# Patient Record
Sex: Male | Born: 1980 | Race: White | Hispanic: No | Marital: Married | Smoking: Current some day smoker
Health system: Southern US, Community
[De-identification: ages and names within clinical notes are randomized; demographics above are authoritative.]

## PROBLEM LIST (undated history)

## (undated) DIAGNOSIS — N200 Calculus of kidney: Secondary | ICD-10-CM

## (undated) DIAGNOSIS — F419 Anxiety disorder, unspecified: Secondary | ICD-10-CM

## (undated) HISTORY — DX: Anxiety disorder, unspecified: F41.9

## (undated) HISTORY — PX: MOUTH SURGERY: SHX715

---

## 2011-08-04 ENCOUNTER — Ambulatory Visit: Payer: Self-pay | Admitting: Internal Medicine

## 2011-08-11 ENCOUNTER — Ambulatory Visit: Payer: Self-pay | Admitting: Urology

## 2011-08-12 ENCOUNTER — Emergency Department: Payer: Self-pay | Admitting: Internal Medicine

## 2011-08-18 ENCOUNTER — Ambulatory Visit: Payer: Self-pay | Admitting: Urology

## 2015-07-08 DIAGNOSIS — F410 Panic disorder [episodic paroxysmal anxiety] without agoraphobia: Secondary | ICD-10-CM | POA: Insufficient documentation

## 2015-07-08 DIAGNOSIS — Z7189 Other specified counseling: Secondary | ICD-10-CM | POA: Insufficient documentation

## 2015-07-15 ENCOUNTER — Encounter: Payer: Self-pay | Admitting: Cardiovascular Disease

## 2015-07-15 ENCOUNTER — Ambulatory Visit (INDEPENDENT_AMBULATORY_CARE_PROVIDER_SITE_OTHER): Payer: BLUE CROSS/BLUE SHIELD | Admitting: Cardiovascular Disease

## 2015-07-15 ENCOUNTER — Encounter (INDEPENDENT_AMBULATORY_CARE_PROVIDER_SITE_OTHER): Payer: Self-pay

## 2015-07-15 VITALS — BP 128/72 | HR 73 | Ht 66.0 in | Wt 136.2 lb

## 2015-07-15 DIAGNOSIS — R079 Chest pain, unspecified: Secondary | ICD-10-CM | POA: Diagnosis not present

## 2015-07-15 DIAGNOSIS — R002 Palpitations: Secondary | ICD-10-CM | POA: Diagnosis not present

## 2015-07-15 DIAGNOSIS — R55 Syncope and collapse: Secondary | ICD-10-CM | POA: Diagnosis not present

## 2015-07-15 NOTE — Progress Notes (Signed)
   HPI  This is a pleasant 34 year old male who was referred from [facility] urgent care for evaluation of chest pain and palpitations. The patient has no previous cardiac history. Other than chronic anxiety and tobacco use, he has no chronic medical conditions. He works in Consulting civil engineer at [employer]. Last month, he smoked marijuana and shortly after that he started having severe substernal chest pain. He reports prolonged history of fluttering sensation and skipping in his heart with no prolonged tachycardia. He also reports recurrent syncopal episodes in the setting of blood draws or seeing blood in general. All the symptoms do not seem to be exertional. He does have known history of anxiety and was started recently on Lexapro. He tries to stay active and exercises and reports no exertional symptoms. He has no family history of premature coronary artery disease, arrhythmia or sudden death.   No Known Allergies   No current outpatient prescriptions on file prior to visit.   No current facility-administered medications on file prior to visit.     History reviewed. No pertinent past medical history.   History reviewed. No pertinent past surgical history.   Family History  Problem Relation Age of Onset  . Family history unknown: Yes     Social History   Social History  . Marital Status: Married    Spouse Name: N/A  . Number of Children: N/A  . Years of Education: N/A   Occupational History  . Not on file.   Social History Main Topics  . Smoking status: Current Some Day Smoker  . Smokeless tobacco: Not on file  . Alcohol Use: Yes  . Drug Use: No  . Sexual Activity: Not on file   Other Topics Concern  . Not on file   Social History Narrative  . No narrative on file     ROS A 10 point review of system was performed. It is negative other than that mentioned in the history of present illness.   PHYSICAL EXAM   BP 128/72 mmHg  Pulse 73  Ht  (1.676 m)  Wt 136 lb 4 oz  (61.803 kg)  BMI 22.00 kg/m2 Constitutional: He is oriented to person, place, and time. He appears well-developed and well-nourished. No distress.  HENT: No nasal discharge.  Head: Normocephalic and atraumatic.  Eyes: Pupils are equal and round.  No discharge. Neck: Normal range of motion. Neck supple. No JVD present. No thyromegaly present.  Cardiovascular: Normal rate, regular rhythm, normal heart sounds. Exam reveals no gallop and no friction rub. No murmur heard.  Pulmonary/Chest: Effort normal and breath sounds normal. No stridor. No respiratory distress. He has no wheezes. He has no rales. He exhibits no tenderness.  Abdominal: Soft. Bowel sounds are normal. He exhibits no distension. There is no tenderness. There is no rebound and no guarding.  Musculoskeletal: Normal range of motion. He exhibits no edema and no tenderness.  Neurological: He is alert and oriented to person, place, and time. Coordination normal.  Skin: Skin is warm and dry. No rash noted. He is not diaphoretic. No erythema. No pallor.  Psychiatric: He has a normal mood and affect. His behavior is normal. Judgment and thought content normal.       ZOX:WRUEA  Rhythm  WITHIN NORMAL LIMITS    ASSESSMENT AND PLAN

## 2015-07-15 NOTE — Assessment & Plan Note (Signed)
Symptoms are overall suggestive of premature beats and no prolonged tachyarrhythmia. I explained to him the benign nature of these in the setting of no structural or ischemic heart disease.

## 2015-07-15 NOTE — Patient Instructions (Signed)
Medication Instructions:  Your physician recommends that you continue on your current medications as directed. Please refer to the Current Medication list given to you today.   Labwork: none  Testing/Procedures: Your physician has requested that you have an echocardiogram. Echocardiography is a painless test that uses sound waves to create images of your heart. It provides your doctor with information about the size and shape of your heart and how well your heart's chambers and valves are working. This procedure takes approximately one hour. There are no restrictions for this procedure.    Follow-Up: Your physician recommends that you schedule a follow-up appointment as needed with Dr. Arida   Any Other Special Instructions Will Be Listed Below (If Applicable).  Echocardiogram An echocardiogram, or echocardiography, uses sound waves (ultrasound) to produce an image of your heart. The echocardiogram is simple, painless, obtained within a short period of time, and offers valuable information to your health care provider. The images from an echocardiogram can provide information such as:  Evidence of coronary artery disease (CAD).  Heart size.  Heart muscle function.  Heart valve function.  Aneurysm detection.  Evidence of a past heart attack.  Fluid buildup around the heart.  Heart muscle thickening.  Assess heart valve function. LET YOUR HEALTH CARE PROVIDER KNOW ABOUT:  Any allergies you have.  All medicines you are taking, including vitamins, herbs, eye drops, creams, and over-the-counter medicines.  Previous problems you or members of your family have had with the use of anesthetics.  Any blood disorders you have.  Previous surgeries you have had.  Medical conditions you have.  Possibility of pregnancy, if this applies. BEFORE THE PROCEDURE  No special preparation is needed. Eat and drink normally.  PROCEDURE   In order to produce an image of your heart, gel  will be applied to your chest and a wand-like tool (transducer) will be moved over your chest. The gel will help transmit the sound waves from the transducer. The sound waves will harmlessly bounce off your heart to allow the heart images to be captured in real-time motion. These images will then be recorded.  You may need an IV to receive a medicine that improves the quality of the pictures. AFTER THE PROCEDURE You may return to your normal schedule including diet, activities, and medicines, unless your health care provider tells you otherwise. Document Released: 10/22/2000 Document Revised: 03/11/2014 Document Reviewed: 07/02/2013 ExitCare Patient Information 2015 [employer]. This information is not intended to replace advice given to you by your health care provider. Make sure you discuss any questions you have with your health care provider.  

## 2015-07-15 NOTE — Assessment & Plan Note (Signed)
The history is highly suggestive of vasovagal syncope. He is not orthostatic. This is a separate issue from his other complaints.

## 2015-07-15 NOTE — Assessment & Plan Note (Signed)
Chest pain is overall atypical and there is likely a component of anxiety. He does have other symptoms associated with this including palpitations which are suggestive of premature beats. I requested an echocardiogram to ensure no structural or congenital heart abnormalities.

## 2015-07-25 ENCOUNTER — Other Ambulatory Visit: Payer: BLUE CROSS/BLUE SHIELD

## 2015-08-21 ENCOUNTER — Encounter: Payer: Self-pay | Admitting: Psychiatry

## 2015-08-21 ENCOUNTER — Ambulatory Visit (INDEPENDENT_AMBULATORY_CARE_PROVIDER_SITE_OTHER): Payer: Self-pay | Admitting: Psychiatry

## 2015-08-21 VITALS — BP 122/68 | HR 74 | Temp 97.6°F | Ht 66.5 in | Wt 135.6 lb

## 2015-08-21 DIAGNOSIS — F4010 Social phobia, unspecified: Secondary | ICD-10-CM

## 2015-08-21 DIAGNOSIS — F410 Panic disorder [episodic paroxysmal anxiety] without agoraphobia: Secondary | ICD-10-CM

## 2015-08-21 MED ORDER — LORAZEPAM 0.5 MG PO TABS: 0.5000 mg | ORAL_TABLET | Freq: Two times a day (BID) | ORAL | Status: DC | PRN

## 2015-08-21 MED ORDER — ESCITALOPRAM OXALATE 10 MG PO TABS: 15.0000 mg | ORAL_TABLET | Freq: Every day | ORAL | Status: DC

## 2015-08-21 NOTE — Progress Notes (Signed)
Psychiatric Initial Adult Assessment   Patient Identification: Russell DonathMark J Duell Jr. MRN:  [id number] Date of Evaluation:  08/21/2015 Referral Source: [facility] Chief Complaint:  "I guess I did not realize I had an anxiety issue." Chief Complaint    Establish Care     Visit Diagnosis:    ICD-9-CM ICD-10-CM   1. Panic disorder 300.01 F41.0   2. Social anxiety disorder 300.23 F40.10    Diagnosis:   Patient Active Problem List   Diagnosis Date Noted  . Pain in the chest [R07.9] 07/15/2015  . Palpitations [R00.2] 07/15/2015  . Vasovagal syncope [R55] 07/15/2015  . Anxiety attack [F41.0] 07/08/2015  . Other specified counseling [Z71.89] 07/08/2015   History of Present Illness:  Patient indicates that one month ago he had a breakdown in his boss's office. He states that he began to cry uncontrollably for about 20-30 minutes. He states then that ended and then he found it very hard to calm down the rest of the day. He states he was very anxious. He states that either the day before or the day after that incident there was a group photo and he developed significant anxiety and if the photo had not been taken when it was he would've had to leave that situation. He indicated that time he developed shortness of breath, anxiety and feeling closed in. However he is able to state that one week ago he went to a concert and he did not have any problems there.   He states that he feels very uncomfortable in closed situations and in social situations. He states that he somehow fears the interaction. Cites for example if there is an event for one of his children he has anxiety about engaging in conversation with other parents.He states he believes that it was because there is no focus on observing him or any issues with him having to engage in conversation or be observed by others  He indicated that about a month ago he began seeing an EAP counselor at work. He states also about one month ago he was  placed on some Lexapro from his primary care physician. He states that initially when he took Lexapro he had some sedation and thus is been taking it at night. He states that he also when he took it initially felt somewhat jumpy and anxious. He states that has now settled down. He believes the Lexapro has helped him and is even out his mood.  Does discuss having some irritability and impatience with others. He states he doesn't feel irritable but it is something that others have noticed such as his wife. He states he has difficulty with attention. For example he states he can be in a meeting at work and that after 30 minutes he is unable to contribute because he has not been focusing. He denies any past diagnosis with ADHD and I did discuss with him I could refer him for assessment for that. However he indicated he would wait and see if it is more related to his anxiety or other issues at this time.  Elements:  Duration:  As above. Associated Signs/Symptoms: Depression Symptoms:  depressed mood, Low motivation and possibly decreased appetite. He states that any depressive episodes occur maybe once per month and last 2 days. (Hypo) Manic Symptoms:  None Anxiety Symptoms:  As discussed above Psychotic Symptoms:  None PTSD Symptoms: NA Patient denies any psychiatric hospitalizations. He denies any psychiatric treatment or counseling on the outpatient basis besides from his recent  contact with EAP. Past Medical History:  Past Medical History  Diagnosis Date  . Anxiety     Past Surgical History  Procedure Laterality Date  . Mouth surgery     Family History:  Family History  Problem Relation Age of Onset  . Alcohol abuse Mother   . Anxiety disorder Mother   . Diabetes Father   . Kidney disease Sister   . Anxiety disorder Sister    Social History:   Social History   Social History  . Marital Status: Married    Spouse Name: N/A  . Number of Children: N/A  . Years of Education: N/A    Social History Main Topics  . Smoking status: Former Smoker    Start date: 05/07/2000    Quit date: 05/07/2004  . Smokeless tobacco: Never Used  . Alcohol Use: 8.4 oz/week    0 Standard drinks or equivalent, 7 Shots of liquor, 7 Cans of beer per week  . Drug Use: Yes    Special: Marijuana     Comment: last used about 3 weeks ago  . Sexual Activity: Yes   Other Topics Concern  . None   Social History Narrative   Additional Social History: Patient's parents divorced. He does continue to see his mother but has not seen his father in several years. He describes a somewhat strained process of their divorce.  Patient has been married for 11 years. He has 2 sons ages 7 and 37. 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 He has been at this job for 9 years and states "overall" he enjoys it.  Family history psychiatric illness he relates that his mother is treated for anxiety and possibly depression. He recalls she's been on Wellbutrin. He states his sisters treated for anxiety is on Celexa.  Musculoskeletal: Strength & Muscle Tone: within normal limits Gait & Station: normal Patient leans: N/A  Psychiatric Specialty Exam: HPI  Review of Systems  Psychiatric/Behavioral: Negative for depression, suicidal ideas, hallucinations, memory loss and substance abuse. The patient is nervous/anxious. The patient does not have insomnia.        He reports some irritability and impaired concentration  All other systems reviewed and are negative.   Blood pressure 122/68, pulse 74, temperature 97.6 F (36.4 C), temperature source Tympanic, height 5' 6.5" (1.689 m), weight 135 lb 9.6 oz (61.508 kg), SpO2 99 %.Body mass index is 21.56 kg/(m^2).  General Appearance: Neat and Well Groomed  Eye Contact:  Good  Speech:  Normal Rate  Volume:  Normal  Mood:  Good  Affect:  Constricted  Thought Process:   Linear and Logical  Orientation:  Full (Time, Place, and Person)  Thought Content:  Negative  Suicidal Thoughts:  No  Homicidal Thoughts:  No  Memory:  Immediate;   Good Recent;   Good Remote;   Good  Judgement:  Good  Insight:  Good  Psychomotor Activity:  Negative  Concentration:  Good  Recall:  Good  Fund of Knowledge:Good  Language: Good  Akathisia:  Negative  Handed:    AIMS (if indicated):  N/A  Assets:  Communication Skills Desire for Improvement Social Support Vocational/Educational  ADL's:  Intact  Cognition: WNL  Sleep:  good   Is the patient at risk to self?  No. Has the patient been a risk to self in the past 6 months?  No. Has the patient been a risk to  self within the distant past?  No. Is the patient a risk to others?  No. Has the patient been a risk to others in the past 6 months?  No. Has the patient been a risk to others within the distant past?  No.  Allergies:  No Known Allergies Current Medications: Current Outpatient Prescriptions  Medication Sig Dispense Refill  . escitalopram (LEXAPRO) 10 MG tablet Take 1.5 tablets (15 mg total) by mouth daily. 30 tablet 1  . hydrOXYzine (ATARAX/VISTARIL) 25 MG tablet TAKE 1 TABLET BY MOUTH FOUR TIMES A DAY AS NEEDED AS NEEDED FOR ANXIETY  0  . LORazepam (ATIVAN) 0.5 MG tablet Take 1 tablet (0.5 mg total) by mouth 2 (two) times daily as needed for anxiety. 60 tablet 1   No current facility-administered medications for this visit.    Previous Psychotropic Medications: Yes  Patient has been on Lexapro as discussed above. He also states that he was prescribed some hydroxyzine 25 mg doses to take for his panic attacks. He relates that it caused sedation and did make him go to sleep. Substance Abuse History in the last 12 months:  No.  Consequences of Substance Abuse: Negative  Medical Decision Making:  New Problem, with no additional work-up planned (3), Review of Medication Regimen & Side Effects (2) and Review  of New Medication or Change in Dosage (2)  Treatment Plan Summary: Medication management and Plan   Panic disorder without agoraphobia-patient reports not having any panic attacks as he had about one month ago. Feels that the Lexapro somewhat helped him. Thus we'll continue on his Lexapro at 15 mg daily.   Social anxiety disorder-this continues to be an issue for the patient. He does desire perhaps some dedication management for these situations. Risk and benefits of lorazepam have been discussed. We'll start lorazepam 0.5 mg twice a day as needed.   In regards to risk assessment the patient has risk factors of gender and race. He has protective factors of good social supports, married, employment, minor children living in the home, no suicide attempts, no symptoms indicative of a major depressive disorder. At this time low risk of imminent harm to himself or others.    Wallace Going 10/13/20169:50 AM

## 2015-09-01 ENCOUNTER — Emergency Department
Admission: EM | Admit: 2015-09-01 | Discharge: 2015-09-01 | Disposition: A | Discharge status: Home / Self Care | Payer: BLUE CROSS/BLUE SHIELD | Attending: Student | Admitting: Student

## 2015-09-01 ENCOUNTER — Emergency Department: Payer: BLUE CROSS/BLUE SHIELD

## 2015-09-01 DIAGNOSIS — R112 Nausea with vomiting, unspecified: Secondary | ICD-10-CM | POA: Insufficient documentation

## 2015-09-01 DIAGNOSIS — Z79899 Other long term (current) drug therapy: Secondary | ICD-10-CM | POA: Insufficient documentation

## 2015-09-01 DIAGNOSIS — N200 Calculus of kidney: Secondary | ICD-10-CM | POA: Diagnosis not present

## 2015-09-01 DIAGNOSIS — N390 Urinary tract infection, site not specified: Secondary | ICD-10-CM | POA: Diagnosis not present

## 2015-09-01 DIAGNOSIS — Z87891 Personal history of nicotine dependence: Secondary | ICD-10-CM | POA: Insufficient documentation

## 2015-09-01 DIAGNOSIS — M549 Dorsalgia, unspecified: Secondary | ICD-10-CM | POA: Diagnosis present

## 2015-09-01 LAB — BASIC METABOLIC PANEL
Anion gap: 6 (ref 5–15)
BUN: 13 mg/dL (ref 6–20)
CALCIUM: 9.2 mg/dL (ref 8.9–10.3)
CO2: 30 mmol/L (ref 22–32)
CREATININE: 1.06 mg/dL (ref 0.61–1.24)
Chloride: 102 mmol/L (ref 101–111)
Glucose, Bld: 111 mg/dL — ABNORMAL HIGH (ref 65–99)
Potassium: 3.8 mmol/L (ref 3.5–5.1)
SODIUM: 138 mmol/L (ref 135–145)

## 2015-09-01 LAB — CBC
HEMATOCRIT: 43.4 % (ref 40.0–52.0)
HEMOGLOBIN: 14.9 g/dL (ref 13.0–18.0)
MCH: 31.3 pg (ref 26.0–34.0)
MCHC: 34.4 g/dL (ref 32.0–36.0)
MCV: 91.2 fL (ref 80.0–100.0)
Platelets: 258 10*3/uL (ref 150–440)
RBC: 4.76 MIL/uL (ref 4.40–5.90)
RDW: 12.5 % (ref 11.5–14.5)
WBC: 6.5 10*3/uL (ref 3.8–10.6)

## 2015-09-01 LAB — URINALYSIS COMPLETE WITH MICROSCOPIC (ARMC ONLY)
BACTERIA UA: NONE SEEN
SPECIFIC GRAVITY, URINE: 1.015 (ref 1.005–1.030)
SQUAMOUS EPITHELIAL / LPF: NONE SEEN

## 2015-09-01 MED ORDER — DEXTROSE 5 % IV SOLN
1.0000 g | Freq: Once | INTRAVENOUS | Status: AC
  Administered 2015-09-01: 1 g via INTRAVENOUS

## 2015-09-01 MED ORDER — DEXTROSE 5 % IV SOLN
INTRAVENOUS | Status: AC
  Administered 2015-09-01: 1 g via INTRAVENOUS
  Filled 2015-09-01: qty 10

## 2015-09-01 MED ORDER — OXYCODONE-ACETAMINOPHEN 5-325 MG PO TABS: 1.0000 | ORAL_TABLET | Freq: Four times a day (QID) | ORAL | Status: DC | PRN

## 2015-09-01 MED ORDER — CEPHALEXIN 500 MG PO CAPS: 500.0000 mg | ORAL_CAPSULE | Freq: Four times a day (QID) | ORAL | Status: DC

## 2015-09-01 NOTE — ED Notes (Signed)
Patient comes complaining of right lower abdominal pain and blood in urine. Symptoms started at 9am.

## 2015-09-01 NOTE — ED Provider Notes (Signed)
[facility] Emergency Department Provider Note  ____________________________________________  Time seen: 1615  I have reviewed the triage vital signs and the nursing notes.   HISTORY  Chief Complaint Flank Pain     HPI Russell Rodgers. is a 34 y.o. male with a history of kidney stones who presents emergency Department with pain in the left flank and hematuria. This began this morning. He denies any nausea vomiting or diarrhea.  He denies any fever. He reports his pain currently is under control and moderate.   Past Medical History  Diagnosis Date  . Anxiety     Patient Active Problem List   Diagnosis Date Noted  . Pain in the chest 07/15/2015  . Palpitations 07/15/2015  . Vasovagal syncope 07/15/2015  . Anxiety attack 07/08/2015  . Other specified counseling 07/08/2015    Past Surgical History  Procedure Laterality Date  . Mouth surgery      Current Outpatient Rx  Name  Route  Sig  Dispense  Refill  . cephALEXin (KEFLEX) 500 MG capsule   Oral   Take 1 capsule (500 mg total) by mouth 4 (four) times daily.   28 capsule   0   . escitalopram (LEXAPRO) 10 MG tablet   Oral   Take 1.5 tablets (15 mg total) by mouth daily.   30 tablet   1   . hydrOXYzine (ATARAX/VISTARIL) 25 MG tablet      TAKE 1 TABLET BY MOUTH FOUR TIMES A DAY AS NEEDED AS NEEDED FOR ANXIETY      0   . LORazepam (ATIVAN) 0.5 MG tablet   Oral   Take 1 tablet (0.5 mg total) by mouth 2 (two) times daily as needed for anxiety.   60 tablet   1   . oxyCODONE-acetaminophen (ROXICET) 5-325 MG tablet   Oral   Take 1 tablet by mouth every 6 (six) hours as needed.   10 tablet   0     Allergies Review of patient's allergies indicates no known allergies.  Family History  Problem Relation Age of Onset  . Alcohol abuse Mother   . Anxiety disorder Mother   . Diabetes Father   . Kidney disease Sister   . Anxiety disorder Sister     Social History Social History   Substance Use Topics  . Smoking status: Former Smoker    Start date: 05/07/2000    Quit date: 05/07/2004  . Smokeless tobacco: Never Used  . Alcohol Use: 8.4 oz/week    0 Standard drinks or equivalent, 7 Shots of liquor, 7 Cans of beer per week    Review of Systems  Constitutional: Negative for fever. ENT: Negative for sore throat. Cardiovascular: Negative for chest pain. Respiratory: Negative for cough. Gastrointestinal: Negative for abdominal pain, vomiting and diarrhea. Genitourinary: Left flank plain with hematuria. See history of present illness Musculoskeletal: No myalgias or injuries. Skin: Negative for rash. Neurological: Negative for paresthesia or weakness   10-point ROS otherwise negative.  ____________________________________________   PHYSICAL EXAM:  VITAL SIGNS: ED Triage Vitals  Enc Vitals Group     BP 09/01/15 1334 127/77 mmHg     Pulse Rate 09/01/15 1334 78     Resp 09/01/15 1334 16     Temp 09/01/15 1334 98.7 F (37.1 C)     Temp Source 09/01/15 1334 Oral     SpO2 09/01/15 1334 97 %     Weight 09/01/15 1334 132 lb (59.875 kg)     Height 09/01/15  1334  (1.651 m)     Head Cir --      Peak Flow --      Pain Score --      Pain Loc --      Pain Edu? --      Excl. in GC? --     Constitutional: Alert and oriented. Well appearing and in no distress. ENT   Head: Normocephalic and atraumatic.   Nose: No congestion/rhinnorhea.    Cardiovascular: Normal rate, regular rhythm, no murmur noted Respiratory:  Normal respiratory effort, no tachypnea.    Breath sounds are clear and equal bilaterally.  Gastrointestinal: No distention.  Mild tenderness in left abdomen. Back: No muscle spasm, no tenderness, no CVA tenderness. Musculoskeletal: No deformity noted. Nontender with normal range of motion in all extremities.  No noted edema. Neurologic:  Normal speech and language. No gross focal neurologic deficits are appreciated.  Skin:  Skin is warm,  dry. No rash noted. Psychiatric: Mood and affect are normal. Speech and behavior are normal.  ____________________________________________    LABS (pertinent positives/negatives)  Labs Reviewed  URINALYSIS COMPLETEWITH MICROSCOPIC ([facility] ONLY) - Abnormal; Notable for the following:    Color, Urine RED (*)    APPearance CLOUDY (*)    Glucose, UA   (*)    Value: TEST NOT REPORTED DUE TO COLOR INTERFERENCE OF URINE PIGMENT   Bilirubin Urine   (*)    Value: TEST NOT REPORTED DUE TO COLOR INTERFERENCE OF URINE PIGMENT   Ketones, ur   (*)    Value: TEST NOT REPORTED DUE TO COLOR INTERFERENCE OF URINE PIGMENT   Hgb urine dipstick   (*)    Value: TEST NOT REPORTED DUE TO COLOR INTERFERENCE OF URINE PIGMENT   Protein, ur   (*)    Value: TEST NOT REPORTED DUE TO COLOR INTERFERENCE OF URINE PIGMENT   Nitrite   (*)    Value: TEST NOT REPORTED DUE TO COLOR INTERFERENCE OF URINE PIGMENT   Leukocytes, UA   (*)    Value: TEST NOT REPORTED DUE TO COLOR INTERFERENCE OF URINE PIGMENT   All other components within normal limits  BASIC METABOLIC PANEL - Abnormal; Notable for the following:    Glucose, Bld 111 (*)    All other components within normal limits  CBC     ____________________________________________   RADIOLOGY  CT abdomen and pelvis, renal stone protocol.  IMPRESSION: 1. No CT findings to explain the patient's clinical symptoms of right flank and right lower quadrant pain. 2. However, however, there has been slight interval enlargement of a chronic nonobstructing left UPJ stone when compared to 08/04/2011. No additional nephrolithiasis identified. 3. The appendix is normal.  ____________________________________________   INITIAL IMPRESSION / ASSESSMENT AND PLAN / ED COURSE  Pertinent labs & imaging results that were available during my care of the patient were reviewed by me and considered in my medical decision making (see chart for details).  Overall well-appearing  34 year old male with history of renal stones with hematuria. Of concern is that he also has pyuria, with white blood cells too numerous to count and clumps of white blood cells. We will obtain a CT scan to help [place] determine whether this is stone with pyuria or pyelonephritis.  ----------------------------------------- 5:42 PM on 09/01/2015 -----------------------------------------  CT does not show any obstructing stones and does not show any stranding consistent with pyelonephritis. Still, the patient has a retained stone from 2012 which is nonobstructing on the left which is  where his discomfort is. I suspect there may be a connection.  The patient appears well currently. He is comfortable and does not need any pain medications. We will begin treatment for the urinary tract infection. Ligament dose of ceftriaxone IV and send him home on Keflex. We have discussed having him follow-up with urology for further evaluation of this urinary tract infection and left flank pain.   ____________________________________________   FINAL CLINICAL IMPRESSION(S) / ED DIAGNOSES  Final diagnoses:  UTI (lower urinary tract infection)  Renal stone      Darien Ramusavid W Quantavis Obryant, MD 09/01/15 1747

## 2015-09-01 NOTE — ED Notes (Signed)
Pt has left flank pain and hematuria.  Sx began this am.  No n/v/d.  Hx of kidney stones.  Pt reports intermittent pain.  Iv in place from triage.

## 2015-09-01 NOTE — Discharge Instructions (Signed)
You have a nonobstructing kidney stone in the left kidney. It appears a little bit bigger than it did in 2012. It is unlikely to be causing an acute problem, but we are concerned it could be related to the urinary tract infection we are identifying today. Your treated with a dose of ceftriaxone in the emergency department. Take Keflex for ongoing antibiotic coverage. Follow-up with urology. Return to the emergency department if you have worsening pain, fevers, or other urgent concerns.  Kidney Stones Kidney stones (urolithiasis) are deposits that form inside your kidneys. The intense pain is caused by the stone moving through the urinary tract. When the stone moves, the ureter goes into spasm around the stone. The stone is usually passed in the urine.  CAUSES   A disorder that makes certain neck glands produce too much parathyroid hormone (primary hyperparathyroidism).  A buildup of uric acid crystals, similar to gout in your joints.  Narrowing (stricture) of the ureter.  A kidney obstruction present at birth (congenital obstruction).  Previous surgery on the kidney or ureters.  Numerous kidney infections. SYMPTOMS   Feeling sick to your stomach (nauseous).  Throwing up (vomiting).  Blood in the urine (hematuria).  Pain that usually spreads (radiates) to the groin.  Frequency or urgency of urination. DIAGNOSIS   Taking a history and physical exam.  Blood or urine tests.  CT scan.  Occasionally, an examination of the inside of the urinary bladder (cystoscopy) is performed. TREATMENT   Observation.  Increasing your fluid intake.  Extracorporeal shock wave lithotripsy--This is a noninvasive procedure that uses shock waves to break up kidney stones.  Surgery may be needed if you have severe pain or persistent obstruction. There are various surgical procedures. Most of the procedures are performed with the use of small instruments. Only small incisions are needed to  accommodate these instruments, so recovery time is minimized. The size, location, and chemical composition are all important variables that will determine the proper choice of action for you. Talk to your health care provider to better understand your situation so that you will minimize the risk of injury to yourself and your kidney.  HOME CARE INSTRUCTIONS   Drink enough water and fluids to keep your urine clear or pale yellow. This will help you to pass the stone or stone fragments.  Strain all urine through the provided strainer. Keep all particulate matter and stones for your health care provider to see. The stone causing the pain may be as small as a grain of salt. It is very important to use the strainer each and every time you pass your urine. The collection of your stone will allow your health care provider to analyze it and verify that a stone has actually passed. The stone analysis will often identify what you can do to reduce the incidence of recurrences.  Only take over-the-counter or prescription medicines for pain, discomfort, or fever as directed by your health care provider.  Keep all follow-up visits as told by your health care provider. This is important.  Get follow-up X-rays if required. The absence of pain does not always mean that the stone has passed. It may have only stopped moving. If the urine remains completely obstructed, it can cause loss of kidney function or even complete destruction of the kidney. It is your responsibility to make sure X-rays and follow-ups are completed. Ultrasounds of the kidney can show blockages and the status of the kidney. Ultrasounds are not associated with any radiation and can  be performed easily in a matter of minutes.  Make changes to your daily diet as told by your health care provider. You may be told to:  Limit the amount of salt that you eat.  Eat 5 or more servings of fruits and vegetables each day.  Limit the amount of meat,  poultry, fish, and eggs that you eat.  Collect a 24-hour urine sample as told by your health care provider.You may need to collect another urine sample every 6-12 months. SEEK MEDICAL CARE IF:  You experience pain that is progressive and unresponsive to any pain medicine you have been prescribed. SEEK IMMEDIATE MEDICAL CARE IF:   Pain cannot be controlled with the prescribed medicine.  You have a fever or shaking chills.  The severity or intensity of pain increases over 18 hours and is not relieved by pain medicine.  You develop a new onset of abdominal pain.  You feel faint or pass out.  You are unable to urinate.   This information is not intended to replace advice given to you by your health care provider. Make sure you discuss any questions you have with your health care provider.   Document Released: 10/25/2005 Document Revised: 07/16/2015 Document Reviewed: 03/28/2013 Elsevier Interactive Patient Education [employer]2016 Elsevier Inc.

## 2015-09-02 ENCOUNTER — Emergency Department
Admission: EM | Admit: 2015-09-02 | Discharge: 2015-09-02 | Disposition: A | Discharge status: Home / Self Care | Payer: BLUE CROSS/BLUE SHIELD | Source: Home / Self Care | Attending: Student | Admitting: Student

## 2015-09-02 ENCOUNTER — Encounter: Payer: Self-pay | Admitting: Emergency Medicine

## 2015-09-02 ENCOUNTER — Emergency Department
Admission: EM | Admit: 2015-09-02 | Discharge: 2015-09-02 | Disposition: A | Discharge status: Home / Self Care | Payer: BLUE CROSS/BLUE SHIELD | Source: Home / Self Care | Attending: Emergency Medicine | Admitting: Emergency Medicine

## 2015-09-02 DIAGNOSIS — N390 Urinary tract infection, site not specified: Secondary | ICD-10-CM

## 2015-09-02 DIAGNOSIS — R112 Nausea with vomiting, unspecified: Secondary | ICD-10-CM

## 2015-09-02 DIAGNOSIS — N200 Calculus of kidney: Secondary | ICD-10-CM

## 2015-09-02 HISTORY — DX: Calculus of kidney: N20.0

## 2015-09-02 LAB — URINALYSIS COMPLETE WITH MICROSCOPIC (ARMC ONLY)
BILIRUBIN URINE: NEGATIVE
GLUCOSE, UA: NEGATIVE mg/dL
Leukocytes, UA: NEGATIVE
NITRITE: NEGATIVE
Protein, ur: NEGATIVE mg/dL
SPECIFIC GRAVITY, URINE: 1.016 (ref 1.005–1.030)
Squamous Epithelial / LPF: NONE SEEN
pH: 8 (ref 5.0–8.0)

## 2015-09-02 MED ORDER — CEPHALEXIN 500 MG PO CAPS: 500.0000 mg | ORAL_CAPSULE | Freq: Once | ORAL | Status: DC

## 2015-09-02 MED ORDER — MORPHINE SULFATE (PF) 4 MG/ML IV SOLN
4.0000 mg | Freq: Once | INTRAVENOUS | Status: AC
  Administered 2015-09-02: 4 mg via INTRAVENOUS

## 2015-09-02 MED ORDER — HYDROMORPHONE HCL 1 MG/ML IJ SOLN
1.0000 mg | Freq: Once | INTRAMUSCULAR | Status: AC
  Administered 2015-09-02: 1 mg via INTRAVENOUS
  Filled 2015-09-02: qty 1

## 2015-09-02 MED ORDER — ONDANSETRON HCL 4 MG/2ML IJ SOLN
4.0000 mg | Freq: Once | INTRAMUSCULAR | Status: DC
Start: 2015-09-02 — End: 2015-09-02

## 2015-09-02 MED ORDER — KETOROLAC TROMETHAMINE 30 MG/ML IJ SOLN
30.0000 mg | Freq: Once | INTRAMUSCULAR | Status: AC
  Administered 2015-09-02: 30 mg via INTRAVENOUS
  Filled 2015-09-02: qty 1

## 2015-09-02 MED ORDER — SODIUM CHLORIDE 0.9 % IV BOLUS (SEPSIS)
1000.0000 mL | Freq: Once | INTRAVENOUS | Status: AC
  Administered 2015-09-02: 1000 mL via INTRAVENOUS

## 2015-09-02 MED ORDER — MORPHINE SULFATE (PF) 4 MG/ML IV SOLN: 4.0000 mg | Freq: Once | INTRAVENOUS | Status: DC

## 2015-09-02 MED ORDER — ONDANSETRON HCL 4 MG/2ML IJ SOLN
4.0000 mg | Freq: Once | INTRAMUSCULAR | Status: AC
  Administered 2015-09-02: 4 mg via INTRAVENOUS
  Filled 2015-09-02: qty 2

## 2015-09-02 MED ORDER — MORPHINE SULFATE (PF) 4 MG/ML IV SOLN
INTRAVENOUS | Status: AC
  Filled 2015-09-02: qty 1

## 2015-09-02 MED ORDER — ONDANSETRON HCL 4 MG/2ML IJ SOLN
4.0000 mg | Freq: Once | INTRAMUSCULAR | Status: AC
  Administered 2015-09-02: 4 mg via INTRAVENOUS

## 2015-09-02 MED ORDER — ONDANSETRON HCL 4 MG/2ML IJ SOLN
INTRAMUSCULAR | Status: AC
  Filled 2015-09-02: qty 2

## 2015-09-02 MED ORDER — ONDANSETRON 4 MG PO TBDP: 4.0000 mg | ORAL_TABLET | Freq: Three times a day (TID) | ORAL | Status: DC | PRN

## 2015-09-02 MED ORDER — CEPHALEXIN 500 MG PO CAPS
500.0000 mg | ORAL_CAPSULE | Freq: Once | ORAL | Status: AC
  Administered 2015-09-02: 500 mg via ORAL
  Filled 2015-09-02: qty 1

## 2015-09-02 NOTE — ED Provider Notes (Signed)
[facility] Emergency Department Provider Note  ____________________________________________  Time seen: Approximately 10:32 AM  I have reviewed the triage vital signs and the nursing notes.   HISTORY  Chief Complaint Flank Pain   HPI Russell Rodgers. is a 34 y.o. male who presents with left-sided groin pain. He has been in the ED twice in the past 24 hours for flank pain and subsequent vomiting and was diagnosed with UTI and non-obstructing left kidney stone. He was discharged on Keflex, Zofran, and Oxycodone. He was last discharged this morning around 4:10 AM. Around 7 AM his pain started again. He took Zofran by mouth as soon as he got it from the pharmacy around 9 AM, then took Oxycodone by mouth but still vomited it up. He vomited several times last night and has had nothing to eat since last night.  He last had a kidney stone in 2012 that he passed.   Past Medical History  Diagnosis Date  . Anxiety   . Kidney stones     Patient Active Problem List   Diagnosis Date Noted  . Pain in the chest 07/15/2015  . Palpitations 07/15/2015  . Vasovagal syncope 07/15/2015  . Anxiety attack 07/08/2015  . Other specified counseling 07/08/2015    Past Surgical History  Procedure Laterality Date  . Mouth surgery      Current Outpatient Rx  Name  Route  Sig  Dispense  Refill  . cephALEXin (KEFLEX) 500 MG capsule   Oral   Take 1 capsule (500 mg total) by mouth 4 (four) times daily.   28 capsule   0   . escitalopram (LEXAPRO) 10 MG tablet   Oral   Take 1.5 tablets (15 mg total) by mouth daily.   30 tablet   1   . hydrOXYzine (ATARAX/VISTARIL) 25 MG tablet      TAKE 1 TABLET BY MOUTH FOUR TIMES A DAY AS NEEDED AS NEEDED FOR ANXIETY      0   . LORazepam (ATIVAN) 0.5 MG tablet   Oral   Take 1 tablet (0.5 mg total) by mouth 2 (two) times daily as needed for anxiety.   60 tablet   1   . ondansetron (ZOFRAN ODT) 4 MG disintegrating tablet    Oral   Take 1 tablet (4 mg total) by mouth every 8 (eight) hours as needed for nausea or vomiting.   20 tablet   0   . oxyCODONE-acetaminophen (ROXICET) 5-325 MG tablet   Oral   Take 1 tablet by mouth every 6 (six) hours as needed.   10 tablet   0     Allergies Review of patient's allergies indicates no known allergies.  Family History  Problem Relation Age of Onset  . Alcohol abuse Mother   . Anxiety disorder Mother   . Diabetes Father   . Kidney disease Sister   . Anxiety disorder Sister     Social History Social History  Substance Use Topics  . Smoking status: Former Smoker    Start date: 05/07/2000    Quit date: 05/07/2004  . Smokeless tobacco: Never Used  . Alcohol Use: 8.4 oz/week    7 Cans of beer, 7 Shots of liquor, 0 Standard drinks or equivalent per week    Review of Systems Constitutional: No fever/chills Cardiovascular: Denies chest pain. Respiratory: Denies shortness of breath. Gastrointestinal: Positive for LLQ pain, nausea, vomiting.  No diarrhea.  No constipation. Genitourinary: Positive for red urine; has not passed urine  yet this morning. Negative for dysuria. Musculoskeletal: Negative for back pain. Skin: Negative for rash.   10-point ROS otherwise negative.  ____________________________________________   PHYSICAL EXAM:  VITAL SIGNS: ED Triage Vitals  Enc Vitals Group     BP 09/02/15 0927 163/113 mmHg     Pulse Rate 09/02/15 0927 77     Resp 09/02/15 0927 20     Temp 09/02/15 0927 98.1 F (36.7 C)     Temp Source 09/02/15 0927 Oral     SpO2 09/02/15 0927 100 %     Weight 09/02/15 0927 132 lb (59.875 kg)     Height 09/02/15 0927 5\' 6"  (1.676 m)     Head Cir --      Peak Flow --      Pain Score 09/02/15 0927 9     Pain Loc --      Pain Edu? --      Excl. in GC? --     Constitutional: Alert and oriented. Curled up lying in bed, in moderate distress, reluctant to move due to pain.  Head: Atraumatic. Neck: No stridor.    Cardiovascular: Normal rate, regular rhythm. Grossly normal heart sounds.  Good peripheral circulation. Respiratory: Normal respiratory effort.  No retractions. Lungs CTAB. Gastrointestinal: Soft, no distention. Hypoactive bowel sounds. Tender to palpation LLQ.  MSK: No CVA tenderness. Neurologic:  Normal speech and language. No gross focal neurologic deficits are appreciated.  Skin:  Skin is warm, dry and intact. No rash noted. Psychiatric: Mood and affect are normal. Speech and behavior are normal.  ____________________________________________   LABS (all labs ordered are listed, but only abnormal results are displayed)  Labs Reviewed - No data to display ____________________________________________  EKG  None ____________________________________________  RADIOLOGY  None; reviewed CT from 09/01/2015. ____________________________________________   PROCEDURES  Procedure(s) performed: None  Critical Care performed: No  ____________________________________________   INITIAL IMPRESSION / ASSESSMENT AND PLAN / ED COURSE  Pertinent labs & imaging results that were available during my care of the patient were reviewed by me and considered in my medical decision making (see chart for details).  Kidney stone causing left lower quadrant pain and vomiting; will give IV Zofran, Dilaudid, and fluids. Status post 2 hours of taking Dilaudid patient still complaining of pain. Nausea has resolved. Patient will be discharged and advised to continue previous medications. Patient also was advised to follow-up with urologist as directed from his previous visit today. ____________________________________________   FINAL CLINICAL IMPRESSION(S) / ED DIAGNOSES  Final diagnoses:  Nausea and vomiting in adult patient  UTI (lower urinary tract infection)  Kidney stone     Joni Reiningonald K Makayla Lanter, PA-C 09/02/15 1225  Gayla DossEryka A Gayle, MD 09/02/15 1615

## 2015-09-02 NOTE — ED Notes (Signed)

## 2015-09-02 NOTE — ED Notes (Signed)
States he was seen times 2 since last pm  States he is unable to keep down any meds and pain is increased

## 2015-09-02 NOTE — ED Notes (Signed)
Pt to ed with c/o left flank pain, states was seen here yesterday for UTI and then again last night for kidney stones,  States went home this am and the pain became worse.

## 2015-09-02 NOTE — ED Notes (Signed)
Spoke with Dr Mayford Knifewilliams regarding pt,  He reviewed labs and radiology from yesterday, no orders received and states patient should go to [facility] for care.

## 2015-09-02 NOTE — Discharge Instructions (Signed)
Kidney Stones °Kidney stones (urolithiasis) are deposits that form inside your kidneys. The intense pain is caused by the stone moving through the urinary tract. When the stone moves, the ureter goes into spasm around the stone. The stone is usually passed in the urine.  °CAUSES  °· A disorder that makes certain neck glands produce too much parathyroid hormone (primary hyperparathyroidism). °· A buildup of uric acid crystals, similar to gout in your joints. °· Narrowing (stricture) of the ureter. °· A kidney obstruction present at birth (congenital obstruction). °· Previous surgery on the kidney or ureters. °· Numerous kidney infections. °SYMPTOMS  °· Feeling sick to your stomach (nauseous). °· Throwing up (vomiting). °· Blood in the urine (hematuria). °· Pain that usually spreads (radiates) to the groin. °· Frequency or urgency of urination. °DIAGNOSIS  °· Taking a history and physical exam. °· Blood or urine tests. °· CT scan. °· Occasionally, an examination of the inside of the urinary bladder (cystoscopy) is performed. °TREATMENT  °· Observation. °· Increasing your fluid intake. °· Extracorporeal shock wave lithotripsy--This is a noninvasive procedure that uses shock waves to break up kidney stones. °· Surgery may be needed if you have severe pain or persistent obstruction. There are various surgical procedures. Most of the procedures are performed with the use of small instruments. Only small incisions are needed to accommodate these instruments, so recovery time is minimized. °The size, location, and chemical composition are all important variables that will determine the proper choice of action for you. Talk to your health care provider to better understand your situation so that you will minimize the risk of injury to yourself and your kidney.  °HOME CARE INSTRUCTIONS  °· Drink enough water and fluids to keep your urine clear or pale yellow. This will help you to pass the stone or stone fragments. °· Strain  all urine through the provided strainer. Keep all particulate matter and stones for your health care provider to see. The stone causing the pain may be as small as a grain of salt. It is very important to use the strainer each and every time you pass your urine. The collection of your stone will allow your health care provider to analyze it and verify that a stone has actually passed. The stone analysis will often identify what you can do to reduce the incidence of recurrences. °· Only take over-the-counter or prescription medicines for pain, discomfort, or fever as directed by your health care provider. °· Keep all follow-up visits as told by your health care provider. This is important. °· Get follow-up X-rays if required. The absence of pain does not always mean that the stone has passed. It may have only stopped moving. If the urine remains completely obstructed, it can cause loss of kidney function or even complete destruction of the kidney. It is your responsibility to make sure X-rays and follow-ups are completed. Ultrasounds of the kidney can show blockages and the status of the kidney. Ultrasounds are not associated with any radiation and can be performed easily in a matter of minutes. °· Make changes to your daily diet as told by your health care provider. You may be told to: °¨ Limit the amount of salt that you eat. °¨ Eat 5 or more servings of fruits and vegetables each day. °¨ Limit the amount of meat, poultry, fish, and eggs that you eat. °· Collect a 24-hour urine sample as told by your health care provider. You may need to collect another urine sample every 6-12   months. °SEEK MEDICAL CARE IF: °· You experience pain that is progressive and unresponsive to any pain medicine you have been prescribed. °SEEK IMMEDIATE MEDICAL CARE IF:  °· Pain cannot be controlled with the prescribed medicine. °· You have a fever or shaking chills. °· The severity or intensity of pain increases over 18 hours and is not  relieved by pain medicine. °· You develop a new onset of abdominal pain. °· You feel faint or pass out. °· You are unable to urinate. °  °This information is not intended to replace advice given to you by your health care provider. Make sure you discuss any questions you have with your health care provider. °  °Document Released: 10/25/2005 Document Revised: 07/16/2015 Document Reviewed: 03/28/2013 °Elsevier Interactive Patient Education ©2016 Elsevier Inc. ° °

## 2015-09-02 NOTE — Discharge Instructions (Signed)
Continue previous medications and contact urology for follow-up evaluation and treatment.

## 2015-09-02 NOTE — ED Provider Notes (Signed)
[facility] Emergency Department Provider Note  ____________________________________________  Time seen: 1:30 AM  I have reviewed the triage vital signs and the nursing notes.   HISTORY  Chief Complaint Back Pain     HPI Russell Rodgers. is a 34 y.o. male presents to the emergency department with worsening left flank pain. Patient was seen yesterday in ER and diagnosed with left ureterolithiasis. Patient states that he was unable to tolerate taking hydrocodone at home secondary to vomiting. Patient states current pain score is 10 out of 10     Past Medical History  Diagnosis Date  . Anxiety   . Kidney stones     Patient Active Problem List   Diagnosis Date Noted  . Pain in the chest 07/15/2015  . Palpitations 07/15/2015  . Vasovagal syncope 07/15/2015  . Anxiety attack 07/08/2015  . Other specified counseling 07/08/2015    Past Surgical History  Procedure Laterality Date  . Mouth surgery      Current Outpatient Rx  Name  Route  Sig  Dispense  Refill  . cephALEXin (KEFLEX) 500 MG capsule   Oral   Take 1 capsule (500 mg total) by mouth 4 (four) times daily.   28 capsule   0   . escitalopram (LEXAPRO) 10 MG tablet   Oral   Take 1.5 tablets (15 mg total) by mouth daily.   30 tablet   1   . hydrOXYzine (ATARAX/VISTARIL) 25 MG tablet      TAKE 1 TABLET BY MOUTH FOUR TIMES A DAY AS NEEDED AS NEEDED FOR ANXIETY      0   . LORazepam (ATIVAN) 0.5 MG tablet   Oral   Take 1 tablet (0.5 mg total) by mouth 2 (two) times daily as needed for anxiety.   60 tablet   1   . oxyCODONE-acetaminophen (ROXICET) 5-325 MG tablet   Oral   Take 1 tablet by mouth every 6 (six) hours as needed.   10 tablet   0     Allergies No known drug allergies  Family History  Problem Relation Age of Onset  . Alcohol abuse Mother   . Anxiety disorder Mother   . Diabetes Father   . Kidney disease Sister   . Anxiety disorder Sister     Social  History Social History  Substance Use Topics  . Smoking status: Former Smoker    Start date: 05/07/2000    Quit date: 05/07/2004  . Smokeless tobacco: Never Used  . Alcohol Use: 8.4 oz/week    7 Cans of beer, 7 Shots of liquor, 0 Standard drinks or equivalent per week    Review of Systems  Constitutional: Negative for fever. Eyes: Negative for visual changes. ENT: Negative for sore throat. Cardiovascular: Negative for chest pain. Respiratory: Negative for shortness of breath. Gastrointestinal: Positive for abdominal pain, positive vomiting  Genitourinary: Negative for dysuria. Musculoskeletal: Positive for back pain. Skin: Negative for rash. Neurological: Negative for headaches, focal weakness or numbness.  10-point ROS otherwise negative.  ____________________________________________   PHYSICAL EXAM:  VITAL SIGNS: ED Triage Vitals  Enc Vitals Group     BP 09/02/15 0012 132/82 mmHg     Pulse Rate 09/02/15 0012 82     Resp 09/02/15 0012 19     Temp 09/02/15 0012 98.3 F (36.8 C)     Temp Source 09/02/15 0012 Oral     SpO2 09/02/15 0012 100 %     Weight 09/02/15 0012 132 lb (59.875  kg)     Height 09/02/15 0012 5\' 6"  (1.676 m)     Head Cir --      Peak Flow --      Pain Score 09/02/15 0014 10     Pain Loc --      Pain Edu? --      Excl. in GC? --     Constitutional: Alert and oriented. Apparent discomfort Eyes: Conjunctivae are normal. PERRL. Normal extraocular movements. ENT   Head: Normocephalic and atraumatic.   Nose: No congestion/rhinnorhea.   Mouth/Throat: Mucous membranes are moist.   Neck: No stridor. Hematological/Lymphatic/Immunilogical: No cervical lymphadenopathy. Cardiovascular: Normal rate, regular rhythm. Normal and symmetric distal pulses are present in all extremities. No murmurs, rubs, or gallops. Respiratory: Normal respiratory effort without tachypnea nor retractions. Breath sounds are clear and equal bilaterally. No  wheezes/rales/rhonchi. Gastrointestinal: Soft and nontender. No distention. There is no CVA tenderness. Genitourinary: deferred Musculoskeletal: Nontender with normal range of motion in all extremities. No joint effusions.  No lower extremity tenderness nor edema. Neurologic:  Normal speech and language. No gross focal neurologic deficits are appreciated. Speech is normal.  Skin:  Skin is warm, dry and intact. No rash noted. Psychiatric: Mood and affect are normal. Speech and behavior are normal. Patient exhibits appropriate insight and judgment.  ____________________________________________    LABS (pertinent positives/negatives)  Labs Reviewed  URINALYSIS COMPLETEWITH MICROSCOPIC ([facility] ONLY) - Abnormal; Notable for the following:    Color, Urine YELLOW (*)    APPearance CLOUDY (*)    Ketones, ur 1+ (*)    Hgb urine dipstick 3+ (*)    Bacteria, UA RARE (*)    All other components within normal limits        INITIAL IMPRESSION / ASSESSMENT AND PLAN / ED COURSE  Pertinent labs & imaging results that were available during my care of the patient were reviewed by me and considered in my medical decision making (see chart for details).  Patient received IV morphine and Zofran for analgesia and antiemetic with improvement of pain. We'll prescribe the patient Zofran for home  ____________________________________________   FINAL CLINICAL IMPRESSION(S) / ED DIAGNOSES  Final diagnoses:  Kidney stone      Darci Currentandolph N Brown, MD 09/02/15 [phone]

## 2015-09-02 NOTE — ED Notes (Signed)
Patient seen here 10/24 and dx with renal colic. D/C to home. Tried pain medication at home but threw it up.

## 2015-09-03 ENCOUNTER — Encounter: Payer: Self-pay | Admitting: Obstetrics and Gynecology

## 2015-09-03 ENCOUNTER — Ambulatory Visit (INDEPENDENT_AMBULATORY_CARE_PROVIDER_SITE_OTHER): Payer: BLUE CROSS/BLUE SHIELD | Admitting: Obstetrics and Gynecology

## 2015-09-03 VITALS — BP 124/78 | HR 73 | Ht 66.0 in | Wt 140.2 lb

## 2015-09-03 DIAGNOSIS — R310 Gross hematuria: Secondary | ICD-10-CM | POA: Diagnosis not present

## 2015-09-03 DIAGNOSIS — N200 Calculus of kidney: Secondary | ICD-10-CM | POA: Diagnosis not present

## 2015-09-03 LAB — MICROSCOPIC EXAMINATION
BACTERIA UA: NONE SEEN
EPITHELIAL CELLS (NON RENAL): NONE SEEN /HPF (ref 0–10)
RBC, UA: >30 /hpf — AB (ref 0–?)
RENAL EPITHEL UA: NONE SEEN /HPF

## 2015-09-03 LAB — URINALYSIS, COMPLETE
Bilirubin, UA: NEGATIVE
Glucose, UA: NEGATIVE
Ketones, UA: NEGATIVE
LEUKOCYTES UA: NEGATIVE
Nitrite, UA: NEGATIVE
PH UA: 6 (ref 5.0–7.5)
PROTEIN UA: NEGATIVE
Specific Gravity, UA: 1.015 (ref 1.005–1.030)
Urobilinogen, Ur: 0.2 mg/dL (ref 0.2–1.0)

## 2015-09-03 MED ORDER — TAMSULOSIN HCL 0.4 MG PO CAPS: 0.4000 mg | ORAL_CAPSULE | Freq: Every day | ORAL | Status: DC

## 2015-09-03 NOTE — Progress Notes (Signed)
09/03/2015 8:30 AM   Russell DonathMark J Files Jr. 06/20/1981 [id number]  Referring provider: Maple Hudsonichard L Gilbert Jr., MD [address] [place], [place] [address]  Chief Complaint  Patient presents with  . Nephrolithiasis    New Patient    HPI: Patient is a 34yo male with a history of renal stones presenting today for follow up after being seen in the ED gross hematuria and acute onset of left upper quadrant pain.  Pain has progressed to his left lower quadrant and resolved today.  No further gross hematuria.  He has been using a urine strainer.  Four previous stone episodes.  Able to pass all with medical management.  H2O intake 1 bottle daily.  09/01/15 Cr 1.06  Former smoker 5 years in college.   PMH: Past Medical History  Diagnosis Date  . Anxiety   . Kidney stones     Surgical History: Past Surgical History  Procedure Laterality Date  . Mouth surgery      Home Medications:    Medication List       This list is accurate as of: 09/03/15 11:59 PM.  Always use your most recent med list.               cephALEXin 500 MG capsule  Commonly known as:  KEFLEX  Take 1 capsule (500 mg total) by mouth 4 (four) times daily.     escitalopram 10 MG tablet  Commonly known as:  LEXAPRO  Take 1.5 tablets (15 mg total) by mouth daily.     hydrOXYzine 25 MG tablet  Commonly known as:  ATARAX/VISTARIL  TAKE 1 TABLET BY MOUTH FOUR TIMES A DAY AS NEEDED AS NEEDED FOR ANXIETY     LORazepam 0.5 MG tablet  Commonly known as:  ATIVAN  Take 1 tablet (0.5 mg total) by mouth 2 (two) times daily as needed for anxiety.     ondansetron 4 MG disintegrating tablet  Commonly known as:  ZOFRAN ODT  Take 1 tablet (4 mg total) by mouth every 8 (eight) hours as needed for nausea or vomiting.     oxyCODONE-acetaminophen 5-325 MG tablet  Commonly known as:  ROXICET  Take 1 tablet by mouth every 6 (six) hours as needed.     tamsulosin 0.4 MG Caps capsule  Commonly known as:  FLOMAX   Take 1 capsule (0.4 mg total) by mouth daily.        Allergies: No Known Allergies  Family History: Family History  Problem Relation Age of Onset  . Alcohol abuse Mother   . Anxiety disorder Mother   . Diabetes Father   . Kidney disease Sister   . Anxiety disorder Sister   . Prostate cancer Maternal Grandfather   . Bladder Cancer Neg Hx   . Nephrolithiasis Father     Social History:  reports that he quit smoking about 11 years ago. He started smoking about 15 years ago. He has never used smokeless tobacco. He reports that he drinks about 8.4 oz of alcohol per week. He reports that he uses illicit drugs (Marijuana).  ROS: UROLOGY Frequent Urination?: Yes Hard to postpone urination?: No Burning/pain with urination?: No Get up at night to urinate?: Yes Leakage of urine?: Yes Urine stream starts and stops?: No Trouble starting stream?: No Do you have to strain to urinate?: No Blood in urine?: Yes Urinary tract infection?: Yes Sexually transmitted disease?: No Injury to kidneys or bladder?: No Painful intercourse?: No Weak stream?: Yes Erection problems?: No  Penile pain?: No  Gastrointestinal Nausea?: Yes Vomiting?: Yes Indigestion/heartburn?: No Diarrhea?: No Constipation?: Yes  Constitutional Fever: No Night sweats?: No Weight loss?: No Fatigue?: No  Skin Skin rash/lesions?: No Itching?: No  Eyes Blurred vision?: No Double vision?: No  Ears/Nose/Throat Sore throat?: No Sinus problems?: No  Hematologic/Lymphatic Swollen glands?: No Easy bruising?: No  Cardiovascular Leg swelling?: No Chest pain?: No  Respiratory Cough?: No Shortness of breath?: No  Endocrine Excessive thirst?: No  Musculoskeletal Back pain?: No Joint pain?: No  Neurological Headaches?: No Dizziness?: No  Psychologic Depression?: No Anxiety?: No  Physical Exam: BP 124/78 mmHg  Pulse 73  Ht  (1.676 m)  Wt 140 lb 3.2 oz (63.594 kg)  BMI 22.64 kg/m2   Constitutional:  Alert and oriented, No acute distress. HEENT: [place] AT, moist mucus membranes.  Trachea midline, no masses. Cardiovascular: No clubbing, cyanosis, or edema. Respiratory: Normal respiratory effort, no increased work of breathing. GI: Abdomen is soft, nontender, nondistended, no abdominal masses GU: No CVA tenderness.  Skin: No rashes, bruises or suspicious lesions. Lymph: No cervical or inguinal adenopathy. Neurologic: Grossly intact, no focal deficits, moving all 4 extremities. Psychiatric: Normal mood and affect.  Laboratory Data:   Urinalysis    Component Value Date/Time   COLORURINE YELLOW* 09/02/2015 0055   APPEARANCEUR CLOUDY* 09/02/2015 0055   LABSPEC 1.016 09/02/2015 0055   PHURINE 8.0 09/02/2015 0055   GLUCOSEU Negative 09/03/2015 1529   HGBUR 3+* 09/02/2015 0055   BILIRUBINUR Negative 09/03/2015 1529   BILIRUBINUR NEGATIVE 09/02/2015 0055   KETONESUR 1+* 09/02/2015 0055   PROTEINUR NEGATIVE 09/02/2015 0055   NITRITE Negative 09/03/2015 1529   NITRITE NEGATIVE 09/02/2015 0055   LEUKOCYTESUR Negative 09/03/2015 1529   LEUKOCYTESUR NEGATIVE 09/02/2015 0055    Pertinent Imaging: CLINICAL DATA: 34 year old male with right flank and right lower quadrant pain for the past 2 days accompanied by hematuria.  EXAM: CT ABDOMEN AND PELVIS WITHOUT CONTRAST  TECHNIQUE: Multidetector CT imaging of the abdomen and pelvis was performed following the standard protocol without IV contrast.  COMPARISON: Prior CT abdomen/pelvis 08/04/2011  FINDINGS: Lower Chest: The lung bases are clear. Visualized cardiac structures are within normal limits for size. No pericardial effusion. Unremarkable visualized distal thoracic esophagus.  Abdomen: Unenhanced CT was performed per clinician order. Lack of IV contrast limits sensitivity and specificity, especially for evaluation of abdominal/pelvic solid viscera. Within these limitations, unremarkable CT appearance of  the stomach, duodenum, spleen, adrenal glands and pancreas.  Normal hepatic contour morphology. No discrete hepatic lesion. Gallbladder is unremarkable. No intra or extrahepatic biliary ductal dilatation.  No evidence of hydronephrosis. 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 No additional nephrolithiasis identified.  No evidence of obstruction or focal bowel wall thickening. Normal appendix in the right lower quadrant. The terminal ileum is unremarkable. Moderate volume of stool throughout the colon. No free fluid or suspicious adenopathy.  Pelvis: Unremarkable bladder, prostate gland and seminal vesicles. No free fluid or suspicious adenopathy.  Bones/Soft Tissues: No acute fracture or aggressive appearing lytic or blastic osseous lesion.  Vascular: Limited evaluation in the absence of IV contrast. No aneurysm or significant atherosclerotic vascular calcification.  IMPRESSION: 1. No CT findings to explain the patient's clinical symptoms of right flank and right lower quadrant pain. 2. However, however, there has been slight interval enlargement of a chronic nonobstructing left UPJ stone when compared to 08/04/2011. No  additional nephrolithiasis identified. 3. The appendix is normal.  Assessment & Plan:    1. Nephrolithiasis- CT Renal Stone Protocol showing no evidence of hydronephrosis. 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 No additional nephrolithiasis identified. Pain resolved today.  Patient instructed to continue Flomax, Oxycodone prn and to push fluids. H ewill continue to strain his urine.  F/u KUB/RUS in 2 weeks. - Urinalysis, Complete  2. Gross hematuria- >30 RBCs on UA today, otherwise unremarkable. We discussed the differential diagnosis for gross  hematuria including nephrolithiasis, renal or upper tract tumors, bladder stones, UTIs, or bladder tumors as well as undetermined etiologies. Per AUA guidelines, I did recommend complete hematuria evaluation including CTU, possible urine cytology, and office cystoscopy.  Patient declined complete work up at this time.   Given his young age and very minimal smoking history I feel that this is reasonable at this time. He will follow up in in 2 weeks for a recheck and KUB/RUS.  Should gross hematuria return we will reconsider performing gross hematuria work up.   Return for KUB and RUS in 2 weeks f/u for results after.  These notes generated with voice recognition software. I apologize for typographical errors.  Earlie Lou, FNP  [facility] [address], [address] [place], [place] [address] [phone]

## 2015-09-17 ENCOUNTER — Ambulatory Visit
Admission: RE | Admit: 2015-09-17 | Discharge: 2015-09-17 | Disposition: A | Discharge status: Home / Self Care | Payer: BLUE CROSS/BLUE SHIELD | Source: Ambulatory Visit | Attending: Obstetrics and Gynecology | Admitting: Obstetrics and Gynecology

## 2015-09-17 DIAGNOSIS — R310 Gross hematuria: Secondary | ICD-10-CM | POA: Diagnosis not present

## 2015-09-17 DIAGNOSIS — N200 Calculus of kidney: Secondary | ICD-10-CM | POA: Insufficient documentation

## 2015-09-18 ENCOUNTER — Ambulatory Visit (INDEPENDENT_AMBULATORY_CARE_PROVIDER_SITE_OTHER): Payer: BLUE CROSS/BLUE SHIELD | Admitting: Psychiatry

## 2015-09-18 ENCOUNTER — Encounter: Payer: Self-pay | Admitting: Psychiatry

## 2015-09-18 VITALS — BP 132/78 | HR 62 | Temp 97.4°F | Ht 66.0 in | Wt 137.6 lb

## 2015-09-18 DIAGNOSIS — F4010 Social phobia, unspecified: Secondary | ICD-10-CM

## 2015-09-18 DIAGNOSIS — F410 Panic disorder [episodic paroxysmal anxiety] without agoraphobia: Secondary | ICD-10-CM | POA: Diagnosis not present

## 2015-09-18 MED ORDER — ESCITALOPRAM OXALATE 10 MG PO TABS: 15.0000 mg | ORAL_TABLET | Freq: Every day | ORAL | Status: DC

## 2015-09-18 NOTE — Progress Notes (Signed)
BH MD/PA/NP OP Progress Note  09/18/2015 10:44 AM Russell Rodgers.  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 He relates that things overall are well. He states that he has not had any significant issues with anxiety. He states he's functioning well at work. He states he is able to enjoy things in his mood is stable. At the last visit we had added some as needed lorazepam for social situations. Patient states that he took it once at his child's birthday party. He states he believes it probably work but also feels like he may have been comforted by some family members being there as well. He states he really has not used it much sense and has not found a situation in which he is needed it.  He denies any side effects from his medications. He indicated he might prefer to just discontinue seeing Clinical research associate. I did discuss with them that would be a reasonable option if his primary care willing to write for medications. I indicated typically primary care is might not want to write for a medication such as lorazepam. Patient seemed to be aware of this and stated his primary care to the does not want to write for as needed medications. Chief Complaint:  Chief Complaint    Follow-up; Medication Refill     Visit Diagnosis:     ICD-9-CM ICD-10-CM   1. Panic disorder 300.01 F41.0   2. Social anxiety disorder 300.23 F40.10     Past Medical History:  Past Medical History  Diagnosis Date  . Anxiety   . Kidney stones     Past Surgical History  Procedure Laterality Date  . Mouth surgery     Family History:  Family History  Problem Relation Age of Onset  . Alcohol abuse Mother   . Anxiety disorder Mother   . Diabetes Father   . Kidney disease Sister   . Anxiety disorder Sister   . Prostate cancer Maternal Grandfather   . Bladder Cancer Neg Hx   . Nephrolithiasis Father    Social History:  Social History    Social History  . Marital Status: Married    Spouse Name: N/A  . Number of Children: N/A  . Years of Education: N/A   Social History Main Topics  . Smoking status: Former Smoker    Start date: 05/07/2000    Quit date: 05/07/2004  . Smokeless tobacco: Never Used  . Alcohol Use: 6.0 - 8.4 oz/week    7 Cans of beer, 0 Standard drinks or equivalent, 0 Glasses of wine, 3-7 Shots of liquor per week  . Drug Use: Yes    Special: Marijuana     Comment: last used last weekend  09-13-15  . Sexual Activity: Yes   Other Topics Concern  . None   Social History Narrative   Additional History:   Assessment:   Musculoskeletal: Strength & Muscle Tone: within normal limits Gait & Station: normal Patient leans: N/A  Psychiatric Specialty Exam: HPI  Review of Systems  Psychiatric/Behavioral: Negative for depression, suicidal ideas, hallucinations, memory loss and substance abuse. The patient is not nervous/anxious and does not have insomnia.   All other systems reviewed and are negative.   Blood pressure 132/78, pulse 62, temperature 97.4 F (36.3 C), temperature source Tympanic, height  (1.676 m), weight 137 lb 9.6 oz (62.415 kg), SpO2 97 %.Body mass index is 22.22 kg/(m^2).  General Appearance: Well  Groomed  Eye Contact:  Good  Speech:  Normal Rate  Volume:  Normal  Mood:  Good  Affect:  Bright, euthymic  Thought Process:  Linear and Logical  Orientation:  Full (Time, Place, and Person)  Thought Content:  Negative  Suicidal Thoughts:  No  Homicidal Thoughts:  No  Memory:  Immediate;   Good Recent;   Good Remote;   Good  Judgement:  Good  Insight:  Good  Psychomotor Activity:  Negative  Concentration:  Good  Recall:  Good  Fund of Knowledge: Good  Language: Good  Akathisia:  Negative  Handed:    AIMS (if indicated):    Assets:  Communication Skills Desire for Improvement Social Support Vocational/Educational  ADL's:  Intact  Cognition: WNL  Sleep:  good    Is the patient at risk to self?  No. Has the patient been a risk to self in the past 6 months?  No. Has the patient been a risk to self within the distant past?  No. Is the patient a risk to others?  No. Has the patient been a risk to others in the past 6 months?  No. Has the patient been a risk to others within the distant past?  No.  Current Medications: Current Outpatient Prescriptions  Medication Sig Dispense Refill  . escitalopram (LEXAPRO) 10 MG tablet Take 1.5 tablets (15 mg total) by mouth daily. 30 tablet 2  . hydrOXYzine (ATARAX/VISTARIL) 25 MG tablet TAKE 1 TABLET BY MOUTH FOUR TIMES A DAY AS NEEDED AS NEEDED FOR ANXIETY  0  . cephALEXin (KEFLEX) 500 MG capsule Take 1 capsule (500 mg total) by mouth 4 (four) times daily. (Patient not taking: Reported on 09/18/2015) 28 capsule 0  . ondansetron (ZOFRAN ODT) 4 MG disintegrating tablet Take 1 tablet (4 mg total) by mouth every 8 (eight) hours as needed for nausea or vomiting. (Patient not taking: Reported on 09/18/2015) 20 tablet 0  . oxyCODONE-acetaminophen (ROXICET) 5-325 MG tablet Take 1 tablet by mouth every 6 (six) hours as needed. (Patient not taking: Reported on 09/18/2015) 10 tablet 0  . tamsulosin (FLOMAX) 0.4 MG CAPS capsule Take 1 capsule (0.4 mg total) by mouth daily. (Patient not taking: Reported on 09/18/2015) 30 capsule 0   No current facility-administered medications for this visit.    Medical Decision Making:  Established Problem, Stable/Improving (1) and Review of Medication Regimen & Side Effects (2)  Treatment Plan Summary:Medication management and Plan   Panic disorder without agoraphobia-patient reports not having any panic attacks as he had about one month ago. Feels that the Lexapro somewhat helped him. Thus we'll continue on his Lexapro at 15 mg daily.   Social anxiety disorder-we had tried some lorazepam which she states he took on one occasion since his last appointment in October. He is not convinced  that something he feels he'll need again.  Patient states that he does not want to continue to follow-up as he feels stable on the current medications. I indicated that was fine as long as he had someone that was willing to write for his medication which at this point will be the Lexapro. He indicated he would check was primary care. I informed him that he had the choice of making an appointment today and then seen with his primary care would write for his medicine and then if so we can cancel the appointment. I also informed him he could check with his primary care and if there is a problem then make the  appointment. He states he would prefer to do the latter. I have given him 3 months worth of the Lexapro in which time he should be able to sort out who will write for his medicine.    Wallace Going 09/18/2015, 10:44 AM

## 2015-09-19 ENCOUNTER — Ambulatory Visit (INDEPENDENT_AMBULATORY_CARE_PROVIDER_SITE_OTHER): Payer: BLUE CROSS/BLUE SHIELD | Admitting: Obstetrics and Gynecology

## 2015-09-19 ENCOUNTER — Encounter: Payer: Self-pay | Admitting: Obstetrics and Gynecology

## 2015-09-19 VITALS — BP 114/75 | HR 61 | Resp 18 | Ht 66.0 in | Wt 136.8 lb

## 2015-09-19 DIAGNOSIS — N200 Calculus of kidney: Secondary | ICD-10-CM | POA: Diagnosis not present

## 2015-09-19 LAB — URINALYSIS, COMPLETE
BILIRUBIN UA: NEGATIVE
Glucose, UA: NEGATIVE
Ketones, UA: NEGATIVE
Leukocytes, UA: NEGATIVE
Nitrite, UA: NEGATIVE
PH UA: 7 (ref 5.0–7.5)
Protein, UA: NEGATIVE
RBC UA: NEGATIVE
Specific Gravity, UA: 1.015 (ref 1.005–1.030)
UUROB: 0.2 mg/dL (ref 0.2–1.0)

## 2015-09-19 LAB — MICROSCOPIC EXAMINATION
Bacteria, UA: NONE SEEN
EPITHELIAL CELLS (NON RENAL): NONE SEEN /HPF (ref 0–10)
RBC, UA: NONE SEEN /hpf (ref 0–?)

## 2015-09-19 NOTE — Patient Instructions (Signed)
Dietary Guidelines to Help Prevent Kidney Stones Your risk of kidney stones can be decreased by adjusting the foods you eat. The most important thing you can do is drink enough fluid. You should drink enough fluid to keep your urine clear or pale yellow. The following guidelines provide specific information for the type of kidney stone you have had. GUIDELINES ACCORDING TO TYPE OF KIDNEY STONE Calcium Oxalate Kidney Stones  Reduce the amount of salt you eat. Foods that have a lot of salt cause your body to release excess calcium into your urine. The excess calcium can combine with a substance called oxalate to form kidney stones.  Reduce the amount of animal protein you eat if the amount you eat is excessive. Animal protein causes your body to release excess calcium into your urine. Ask your dietitian how much protein from animal sources you should be eating.  Avoid foods that are high in oxalates. If you take vitamins, they should have less than 500 mg of vitamin C. Your body turns vitamin C into oxalates. You do not need to avoid fruits and vegetables high in vitamin C. Calcium Phosphate Kidney Stones  Reduce the amount of salt you eat to help prevent the release of excess calcium into your urine.  Reduce the amount of animal protein you eat if the amount you eat is excessive. Animal protein causes your body to release excess calcium into your urine. Ask your dietitian how much protein from animal sources you should be eating.  Get enough calcium from food or take a calcium supplement (ask your dietitian for recommendations). Food sources of calcium that do not increase your risk of kidney stones include:  Broccoli.  Dairy products, such as cheese and yogurt.  Pudding. Uric Acid Kidney Stones  Do not have more than 6 oz of animal protein per day. FOOD SOURCES Animal Protein Sources  Meat (all types).  Poultry.  Eggs.  Fish, seafood. Foods High in Salt  Salt seasonings.  Soy  sauce.  Teriyaki sauce.  Cured and processed meats.  Salted crackers and snack foods.  Fast food.  Canned soups and most canned foods. Foods High in Oxalates  Grains:  Amaranth.  Barley.  Grits.  Wheat germ.  Bran.  Buckwheat flour.  All bran cereals.  Pretzels.  Whole wheat bread.  Vegetables:  Beans (wax).  Beets and beet greens.  Collard greens.  Eggplant.  Escarole.  Leeks.  Okra.  Parsley.  Rutabagas.  Spinach.  Swiss chard.  Tomato paste.  Fried potatoes.  Sweet potatoes.  Fruits:  Red currants.  Figs.  Kiwi.  Rhubarb.  Meat and Other Protein Sources:  Beans (dried).  Soy burgers and other soybean products.  Miso.  Nuts (peanuts, almonds, pecans, cashews, hazelnuts).  Nut butters.  Sesame seeds and tahini (paste made of sesame seeds).  Poppy seeds.  Beverages:  Chocolate drink mixes.  Soy milk.  Instant iced tea.  Juices made from high-oxalate fruits or vegetables.  Other:  Carob.  Chocolate.  Fruitcake.  Marmalades.   This information is not intended to replace advice given to you by your health care provider. Make sure you discuss any questions you have with your health care provider.   Document Released: 02/19/2011 Document Revised: 10/30/2013 Document Reviewed: 09/21/2013 Elsevier Interactive Patient Education 2016 Elsevier Inc.  

## 2015-09-19 NOTE — Progress Notes (Signed)
09/19/2015 2:07 PM   Russell Rodgers. December 27, 1980 [id number]  Referring provider: Maple Hudson., MD [address] [place], [place] [address]  Chief Complaint  Patient presents with  . Nephrolithiasis  . Results    KUB, RUS    HPI: Patient is a 34yo male presenting today for follow up to review KUB and RUS. He did not get his KUB done.  He states that he passed a stone a few days after his last visit. He brought his stone with him today for analysis. No further hematuria, pain or urinary complaints.   Previous History: Patient is a 34yo male with a history of renal stones presenting today for follow up after being seen in the ED gross hematuria and acute onset of left upper quadrant pain. Pain has progressed to his left lower quadrant and resolved today. CT demonstrated chronic left UPJ stone without signs of obstruction. No further gross hematuria. He has been using a urine strainer.  Four previous stone episodes. Able to pass all with medical management.  H2O intake 1 bottle daily.  09/01/15 Cr 1.06  Former smoker 5 years in college.    PMH: Past Medical History  Diagnosis Date  . Anxiety   . Kidney stones     Surgical History: Past Surgical History  Procedure Laterality Date  . Mouth surgery      Home Medications:    Medication List       This list is accurate as of: 09/19/15  2:07 PM.  Always use your most recent med list.               escitalopram 10 MG tablet  Commonly known as:  LEXAPRO  Take 1.5 tablets (15 mg total) by mouth daily.     hydrOXYzine 25 MG tablet  Commonly known as:  ATARAX/VISTARIL  TAKE 1 TABLET BY MOUTH FOUR TIMES A DAY AS NEEDED AS NEEDED FOR ANXIETY        Allergies: No Known Allergies  Family History: Family History  Problem Relation Age of Onset  . Alcohol abuse Mother   . Anxiety disorder Mother   . Diabetes Father   . Kidney disease Sister   . Anxiety disorder Sister   . Prostate cancer  Maternal Grandfather   . Bladder Cancer Neg Hx   . Nephrolithiasis Father     Social History:  reports that he quit smoking about 11 years ago. He started smoking about 15 years ago. He has never used smokeless tobacco. He reports that he drinks about 6.0 - 8.4 oz of alcohol per week. He reports that he uses illicit drugs (Marijuana).  ROS: UROLOGY Frequent Urination?: No Hard to postpone urination?: No Burning/pain with urination?: No Get up at night to urinate?: No Leakage of urine?: No Urine stream starts and stops?: No Trouble starting stream?: No Do you have to strain to urinate?: No Blood in urine?: No Urinary tract infection?: No Sexually transmitted disease?: No Injury to kidneys or bladder?: No Painful intercourse?: No Weak stream?: No Erection problems?: No Penile pain?: No  Gastrointestinal Nausea?: No Vomiting?: No Indigestion/heartburn?: No Diarrhea?: No Constipation?: No  Constitutional Fever: No Night sweats?: No Weight loss?: No Fatigue?: No  Skin Skin rash/lesions?: No Itching?: No  Eyes Blurred vision?: No Double vision?: No  Ears/Nose/Throat Sore throat?: No Sinus problems?: No  Hematologic/Lymphatic Swollen glands?: No Easy bruising?: No  Cardiovascular Leg swelling?: No Chest pain?: No  Respiratory Cough?: No Shortness of breath?: No  Endocrine Excessive thirst?: No  Musculoskeletal Back pain?: No Joint pain?: No  Neurological Headaches?: No Dizziness?: No  Psychologic Depression?: No Anxiety?: Yes  Physical Exam: BP 114/75 mmHg  Pulse 61  Resp 18  Ht 5\' 6"  (1.676 m)  Wt 136 lb 12.8 oz (62.052 kg)  BMI 22.09 kg/m2  Constitutional:  Alert and oriented, No acute distress. HEENT: [place] AT, moist mucus membranes.  Trachea midline, no masses. Cardiovascular: No clubbing, cyanosis, or edema. Respiratory: Normal respiratory effort, no increased work of breathing. Skin: No rashes, bruises or suspicious lesions. Lymph:  No cervical or inguinal adenopathy. Neurologic: Grossly intact, no focal deficits, moving all 4 extremities. Psychiatric: Normal mood and affect.  Laboratory Data:   Urinalysis    Component Value Date/Time   COLORURINE YELLOW* 09/02/2015 0055   APPEARANCEUR CLOUDY* 09/02/2015 0055   LABSPEC 1.016 09/02/2015 0055   PHURINE 8.0 09/02/2015 0055   GLUCOSEU Negative 09/19/2015 0852   HGBUR 3+* 09/02/2015 0055   BILIRUBINUR Negative 09/19/2015 0852   BILIRUBINUR NEGATIVE 09/02/2015 0055   KETONESUR 1+* 09/02/2015 0055   PROTEINUR NEGATIVE 09/02/2015 0055   NITRITE Negative 09/19/2015 0852   NITRITE NEGATIVE 09/02/2015 0055   LEUKOCYTESUR Negative 09/19/2015 0852   LEUKOCYTESUR NEGATIVE 09/02/2015 0055    Pertinent Imaging:   Assessment & Plan:   1. Nephrolithiasis-  Patient passed UPJ stone.  RUS normal. RTC 3 months to review litholink results. We discussed general stone prevention techniques including drinking plenty water with goal of producing 2.5 L urine daily, increased citric acid intake, avoidance of high oxalate containing foods, and decreased salt intake.  Information about dietary recommendations given today.  -Litholink ordered -Stone analysis - Urinalysis, Complete  ` Return in about 3 months (around 12/20/2015).  These notes generated with voice recognition software. I apologize for typographical errors.  Earlie LouLindsay Eulalie Speights, FNP  [facility] [address], [address] [place], [place] [address] [phone]

## 2015-09-29 ENCOUNTER — Encounter: Payer: Self-pay | Admitting: Urology

## 2015-10-13 ENCOUNTER — Other Ambulatory Visit: Payer: Self-pay

## 2015-10-13 MED ORDER — LORAZEPAM 0.5 MG PO TABS: 0.5000 mg | ORAL_TABLET | Freq: Two times a day (BID) | ORAL | Status: DC | PRN

## 2015-10-13 NOTE — Telephone Encounter (Signed)
faxed - confirmed rx faxed for ativan .5mg   id # [id number] order id # [id number]

## 2015-10-13 NOTE — Telephone Encounter (Signed)
pt states that he is going out of the country and that he will not be back until end of feb. pt was wanting to get a refill on ativan .5mg 

## 2015-10-13 NOTE — Telephone Encounter (Signed)
[facility]

## 2015-12-22 ENCOUNTER — Encounter: Payer: Self-pay | Admitting: Obstetrics and Gynecology

## 2015-12-22 ENCOUNTER — Ambulatory Visit (INDEPENDENT_AMBULATORY_CARE_PROVIDER_SITE_OTHER): Payer: BLUE CROSS/BLUE SHIELD | Admitting: Obstetrics and Gynecology

## 2015-12-22 VITALS — BP 114/77 | HR 61 | Resp 16 | Ht 66.0 in | Wt 141.5 lb

## 2015-12-22 DIAGNOSIS — N200 Calculus of kidney: Secondary | ICD-10-CM | POA: Diagnosis not present

## 2015-12-22 NOTE — Progress Notes (Signed)
9:13 AM   Russell Rodgers. 06/01/1981 [id number]  Referring provider: Marisue Ivan, MD [phone] [facility] [address] [facility] [place], [place] [address]  Chief Complaint  Patient presents with  . Nephrolithiasis  . Follow-up    3 mo.    HPI: Patient is a 35yo male with a history of renal stones who initially presented for follow up after being seen in the ED gross hematuria and acute onset of left upper quadrant pain. Pain progressed to his left lower quadrant and then resolved. CT demonstrated chronic left UPJ stone without signs of obstruction. No further gross hematuria. He has been using a urine strainer.  Four previous stone episodes. Able to pass all with medical management.  H2O intake 1 bottle daily.  09/01/15 Cr 1.06  Former smoker 5 years in college.  He states that he passed a stone a few days after his inital visit. He brought his stone for analysis. No further hematuria, pain or urinary complaints.   RUS 09/17/15 no acute abnormality noted  He does today to review his 24 hour urinalysis as well as his stone analysis. Patient states that he has been unable to perform the 24 urinalysis because he has been traveling out of the country. He has experienced occasional intermittent left lower quadrant pain when he bends over. Otherwise he hasno complaints.   PMH: Past Medical History  Diagnosis Date  . Anxiety   . Kidney stones     Surgical History: Past Surgical History  Procedure Laterality Date  . Mouth surgery      Home Medications:    Medication List       This list is accurate as of: 12/22/15  9:13 AM.  Always use your most recent med list.               escitalopram 10 MG tablet  Commonly known as:  LEXAPRO  Take 1.5 tablets (15 mg total) by mouth daily.     hydrOXYzine 25 MG tablet  Commonly known as:  ATARAX/VISTARIL  TAKE 1 TABLET BY MOUTH FOUR TIMES A DAY AS NEEDED AS NEEDED FOR ANXIETY     LORazepam 0.5 MG tablet  Commonly  known as:  ATIVAN  Take 1 tablet (0.5 mg total) by mouth 2 (two) times daily as needed for anxiety.        Allergies: No Known Allergies  Family History: Family History  Problem Relation Age of Onset  . Alcohol abuse Mother   . Anxiety disorder Mother   . Diabetes Father   . Kidney disease Sister   . Anxiety disorder Sister   . Prostate cancer Maternal Grandfather   . Bladder Cancer Neg Hx   . Nephrolithiasis Father     Social History:  reports that he quit smoking about 11 years ago. He started smoking about 15 years ago. He has never used smokeless tobacco. He reports that he drinks about 6.0 - 8.4 oz of alcohol per week. He reports that he uses illicit drugs (Marijuana).  ROS: UROLOGY Frequent Urination?: No Hard to postpone urination?: No Burning/pain with urination?: No Get up at night to urinate?: No Leakage of urine?: No Urine stream starts and stops?: No Trouble starting stream?: No Do you have to strain to urinate?: No Blood in urine?: No Urinary tract infection?: No Sexually transmitted disease?: No Injury to kidneys or bladder?: No Painful intercourse?: No Weak stream?: No Erection problems?: No Penile pain?: No  Gastrointestinal Nausea?: No Vomiting?: No Indigestion/heartburn?: No Diarrhea?:  No Constipation?: No  Constitutional Fever: No Night sweats?: No Weight loss?: No Fatigue?: No  Skin Skin rash/lesions?: No Itching?: No  Eyes Blurred vision?: No Double vision?: No  Ears/Nose/Throat Sore throat?: No Sinus problems?: No  Hematologic/Lymphatic Swollen glands?: No Easy bruising?: No  Cardiovascular Leg swelling?: No Chest pain?: No  Respiratory Cough?: No Shortness of breath?: No  Endocrine Excessive thirst?: No  Musculoskeletal Back pain?: No Joint pain?: No  Neurological Headaches?: No Dizziness?: No  Psychologic Depression?: No Anxiety?: Yes  Physical Exam: BP 114/77 mmHg  Pulse 61  Resp 16  Ht   (1.676 m)  Wt 141 lb 8 oz (64.184 kg)  BMI 22.85 kg/m2  Constitutional:  Alert and oriented, No acute distress. HEENT:  AT, moist mucus membranes.  Trachea midline, no masses. Cardiovascular: No clubbing, cyanosis, or edema. Respiratory: Normal respiratory effort, no increased work of breathing. Skin: No rashes, bruises or suspicious lesions. Lymph: No cervical or inguinal adenopathy. Neurologic: Grossly intact, no focal deficits, moving all 4 extremities. Psychiatric: Normal mood and affect.  Laboratory Data:   Urinalysis    Component Value Date/Time   COLORURINE YELLOW* 09/02/2015 0055   APPEARANCEUR CLOUDY* 09/02/2015 0055   LABSPEC 1.016 09/02/2015 0055   PHURINE 8.0 09/02/2015 0055   GLUCOSEU Negative 09/19/2015 0852   HGBUR 3+* 09/02/2015 0055   BILIRUBINUR Negative 09/19/2015 0852   BILIRUBINUR NEGATIVE 09/02/2015 0055   KETONESUR 1+* 09/02/2015 0055   PROTEINUR NEGATIVE 09/02/2015 0055   NITRITE Negative 09/19/2015 0852   NITRITE NEGATIVE 09/02/2015 0055   LEUKOCYTESUR Negative 09/19/2015 0852   LEUKOCYTESUR NEGATIVE 09/02/2015 0055    Pertinent Imaging:   Assessment & Plan:   1. Nephrolithiasis-  Patient passed UPJ stone.  RUS normal. RTC 3 months to review litholink results. We discussed general stone prevention techniques including drinking plenty water with goal of producing 2.5 L urine daily, increased citric acid intake, avoidance of high oxalate containing foods, and decreased salt intake.  Information about dietary recommendations given today.  -Litholink ordered -Stone analysis - Urinalysis, Complete  ` No Follow-up on file.  These notes generated with voice recognition software. I apologize for typographical errors.  Earlie Lou, FNP  [facility] [address] [place], [place] [address] [phone]

## 2015-12-22 NOTE — Patient Instructions (Signed)
Kidney Stones Kidney stones (urolithiasis) are deposits that form inside your kidneys. The intense pain is caused by the stone moving through the urinary tract. When the stone moves, the ureter goes into spasm around the stone. The stone is usually passed in the urine.  CAUSES   A disorder that makes certain neck glands produce too much parathyroid hormone (primary hyperparathyroidism).  A buildup of uric acid crystals, similar to gout in your joints.  Narrowing (stricture) of the ureter.  A kidney obstruction present at birth (congenital obstruction).  Previous surgery on the kidney or ureters.  Numerous kidney infections. SYMPTOMS   Feeling sick to your stomach (nauseous).  Throwing up (vomiting).  Blood in the urine (hematuria).  Pain that usually spreads (radiates) to the groin.  Frequency or urgency of urination. DIAGNOSIS   Taking a history and physical exam.  Blood or urine tests.  CT scan.  Occasionally, an examination of the inside of the urinary bladder (cystoscopy) is performed. TREATMENT   Observation.  Increasing your fluid intake.  Extracorporeal shock wave lithotripsy--This is a noninvasive procedure that uses shock waves to break up kidney stones.  Surgery may be needed if you have severe pain or persistent obstruction. There are various surgical procedures. Most of the procedures are performed with the use of small instruments. Only small incisions are needed to accommodate these instruments, so recovery time is minimized. The size, location, and chemical composition are all important variables that will determine the proper choice of action for you. Talk to your health care provider to better understand your situation so that you will minimize the risk of injury to yourself and your kidney.  HOME CARE INSTRUCTIONS   Drink enough water and fluids to keep your urine clear or pale yellow. This will help you to pass the stone or stone fragments.  Strain  all urine through the provided strainer. Keep all particulate matter and stones for your health care provider to see. The stone causing the pain may be as small as a grain of salt. It is very important to use the strainer each and every time you pass your urine. The collection of your stone will allow your health care provider to analyze it and verify that a stone has actually passed. The stone analysis will often identify what you can do to reduce the incidence of recurrences.  Only take over-the-counter or prescription medicines for pain, discomfort, or fever as directed by your health care provider.  Keep all follow-up visits as told by your health care provider. This is important.  Get follow-up X-rays if required. The absence of pain does not always mean that the stone has passed. It may have only stopped moving. If the urine remains completely obstructed, it can cause loss of kidney function or even complete destruction of the kidney. It is your responsibility to make sure X-rays and follow-ups are completed. Ultrasounds of the kidney can show blockages and the status of the kidney. Ultrasounds are not associated with any radiation and can be performed easily in a matter of minutes.  Make changes to your daily diet as told by your health care provider. You may be told to:  Limit the amount of salt that you eat.  Eat 5 or more servings of fruits and vegetables each day.  Limit the amount of meat, poultry, fish, and eggs that you eat.  Collect a 24-hour urine sample as told by your health care provider.You may need to collect another urine sample every 6-12   months. SEEK MEDICAL CARE IF:  You experience pain that is progressive and unresponsive to any pain medicine you have been prescribed. SEEK IMMEDIATE MEDICAL CARE IF:   Pain cannot be controlled with the prescribed medicine.  You have a fever or shaking chills.  The severity or intensity of pain increases over 18 hours and is not  relieved by pain medicine.  You develop a new onset of abdominal pain.  You feel faint or pass out.  You are unable to urinate.   This information is not intended to replace advice given to you by your health care provider. Make sure you discuss any questions you have with your health care provider.   Document Released: 10/25/2005 Document Revised: 07/16/2015 Document Reviewed: 03/28/2013 Elsevier Interactive Patient Education 2016 Elsevier Inc. Dietary Guidelines to Help Prevent Kidney Stones Your risk of kidney stones can be decreased by adjusting the foods you eat. The most important thing you can do is drink enough fluid. You should drink enough fluid to keep your urine clear or pale yellow. The following guidelines provide specific information for the type of kidney stone you have had. GUIDELINES ACCORDING TO TYPE OF KIDNEY STONE Calcium Oxalate Kidney Stones  Reduce the amount of salt you eat. Foods that have a lot of salt cause your body to release excess calcium into your urine. The excess calcium can combine with a substance called oxalate to form kidney stones.  Reduce the amount of animal protein you eat if the amount you eat is excessive. Animal protein causes your body to release excess calcium into your urine. Ask your dietitian how much protein from animal sources you should be eating.  Avoid foods that are high in oxalates. If you take vitamins, they should have less than 500 mg of vitamin C. Your body turns vitamin C into oxalates. You do not need to avoid fruits and vegetables high in vitamin C. Calcium Phosphate Kidney Stones  Reduce the amount of salt you eat to help prevent the release of excess calcium into your urine.  Reduce the amount of animal protein you eat if the amount you eat is excessive. Animal protein causes your body to release excess calcium into your urine. Ask your dietitian how much protein from animal sources you should be eating.  Get enough  calcium from food or take a calcium supplement (ask your dietitian for recommendations). Food sources of calcium that do not increase your risk of kidney stones include:  Broccoli.  Dairy products, such as cheese and yogurt.  Pudding. Uric Acid Kidney Stones  Do not have more than 6 oz of animal protein per day. FOOD SOURCES Animal Protein Sources  Meat (all types).  Poultry.  Eggs.  Fish, seafood. Foods High in Salt  Salt seasonings.  Soy sauce.  Teriyaki sauce.  Cured and processed meats.  Salted crackers and snack foods.  Fast food.  Canned soups and most canned foods. Foods High in Oxalates  Grains:  Amaranth.  Barley.  Grits.  Wheat germ.  Bran.  Buckwheat flour.  All bran cereals.  Pretzels.  Whole wheat bread.  Vegetables:  Beans (wax).  Beets and beet greens.  Collard greens.  Eggplant.  Escarole.  Leeks.  Okra.  Parsley.  Rutabagas.  Spinach.  Swiss chard.  Tomato paste.  Fried potatoes.  Sweet potatoes.  Fruits:  Red currants.  Figs.  Kiwi.  Rhubarb.  Meat and Other Protein Sources:  Beans (dried).  Soy burgers and other soybean products.  Miso.    Nuts (peanuts, almonds, pecans, cashews, hazelnuts).  Nut butters.  Sesame seeds and tahini (paste made of sesame seeds).  Poppy seeds.  Beverages:  Chocolate drink mixes.  Soy milk.  Instant iced tea.  Juices made from high-oxalate fruits or vegetables.  Other:  Carob.  Chocolate.  Fruitcake.  Marmalades.   This information is not intended to replace advice given to you by your health care provider. Make sure you discuss any questions you have with your health care provider.   Document Released: 02/19/2011 Document Revised: 10/30/2013 Document Reviewed: 09/21/2013 Elsevier Interactive Patient Education 2016 Elsevier Inc.  

## 2016-03-02 ENCOUNTER — Other Ambulatory Visit: Payer: Self-pay | Admitting: Medical

## 2016-03-02 ENCOUNTER — Ambulatory Visit
Admission: RE | Admit: 2016-03-02 | Discharge: 2016-03-02 | Disposition: A | Discharge status: Home / Self Care | Payer: BLUE CROSS/BLUE SHIELD | Source: Ambulatory Visit | Attending: Medical | Admitting: Medical

## 2016-03-02 DIAGNOSIS — T148XXA Other injury of unspecified body region, initial encounter: Secondary | ICD-10-CM

## 2016-03-02 DIAGNOSIS — M25571 Pain in right ankle and joints of right foot: Secondary | ICD-10-CM | POA: Diagnosis present

## 2016-09-13 DIAGNOSIS — E782 Mixed hyperlipidemia: Secondary | ICD-10-CM | POA: Insufficient documentation

## 2017-09-17 IMAGING — US US RENAL
1 series · 14 of 25 positions shown · non-contrast
Comparison: 09/01/2015

CLINICAL DATA: History of Nephrolithiasis and hematuria, recently
passed left-sided renal stone

EXAM:
RENAL / URINARY TRACT ULTRASOUND COMPLETE

[Series 1: us renal · 0.23mm/px · 14 of 44 slices shown]
[im 1/44]
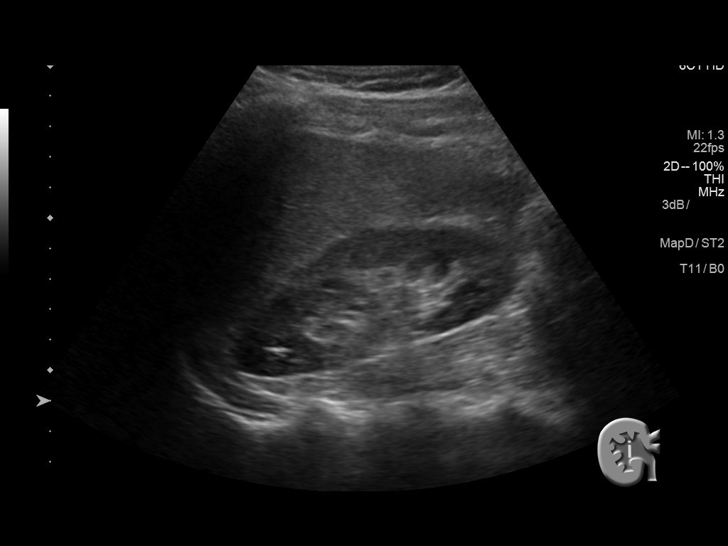
[im 4/44]
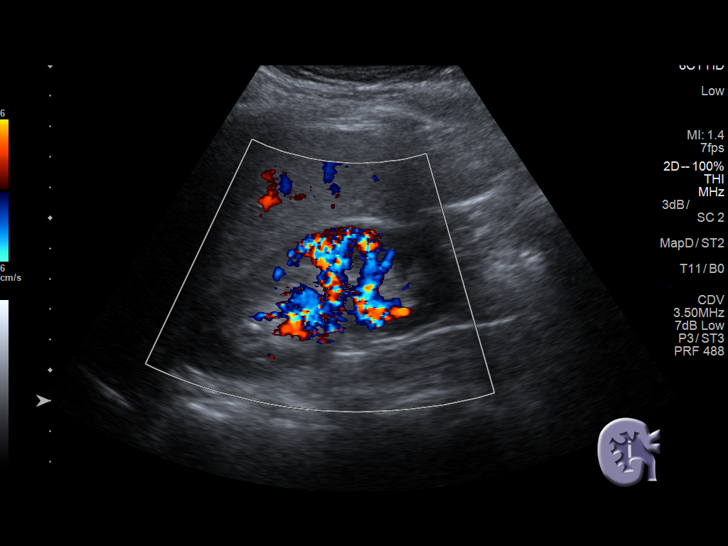
[im 8/44]
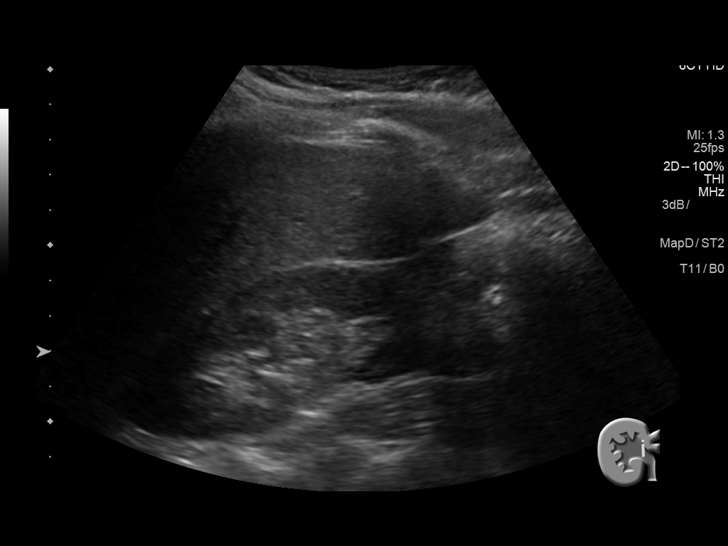
[im 11/44]
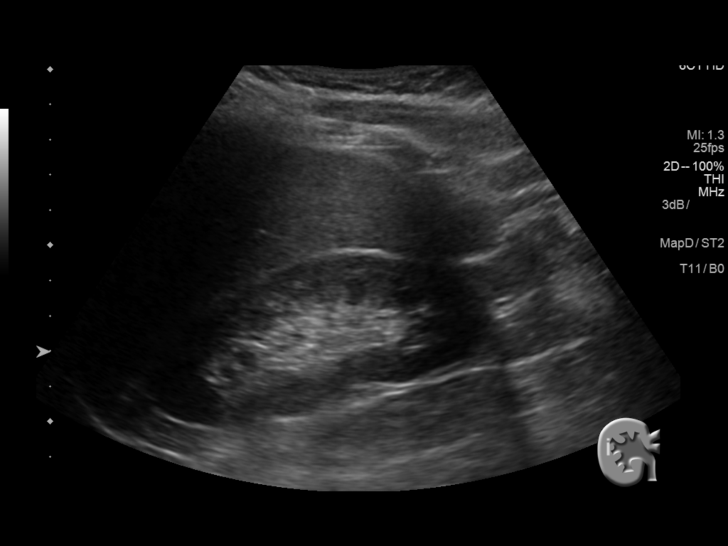
[im 15/44]
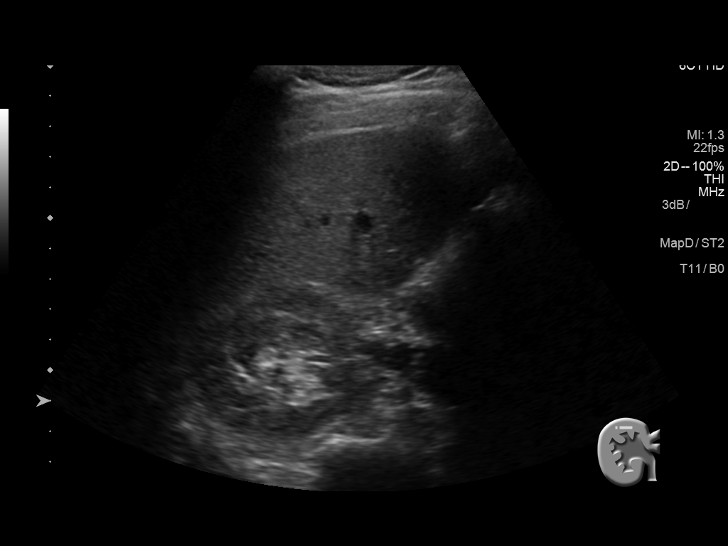
[im 17/44]
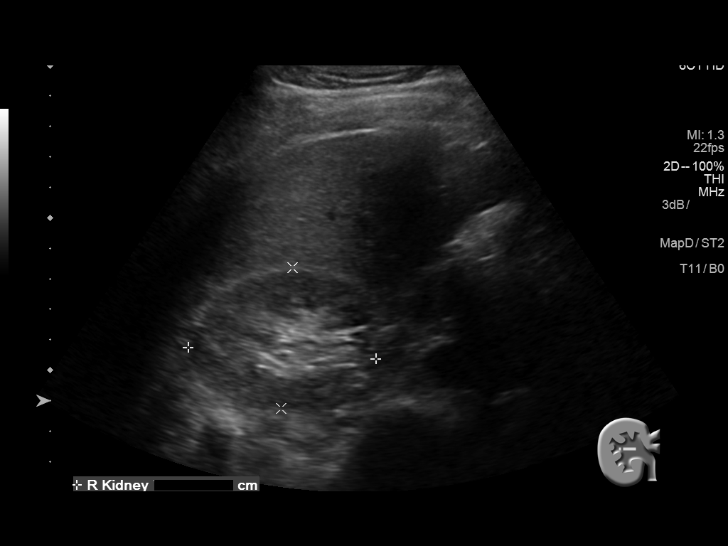
[im 20/44]
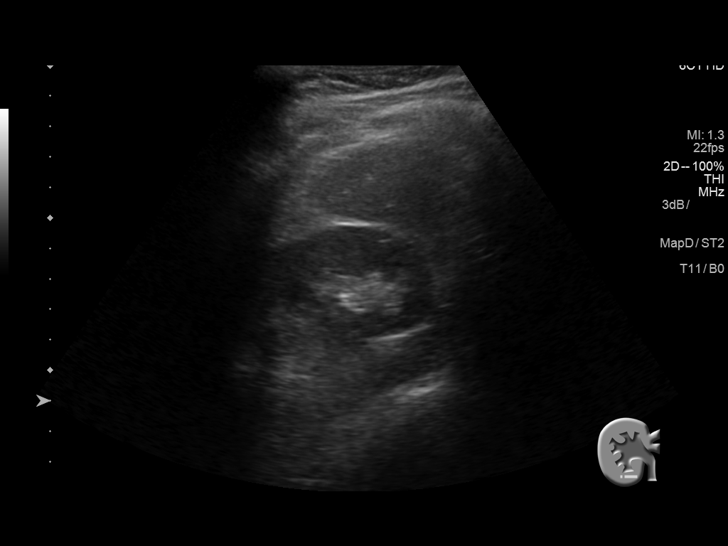
[im 24/44]
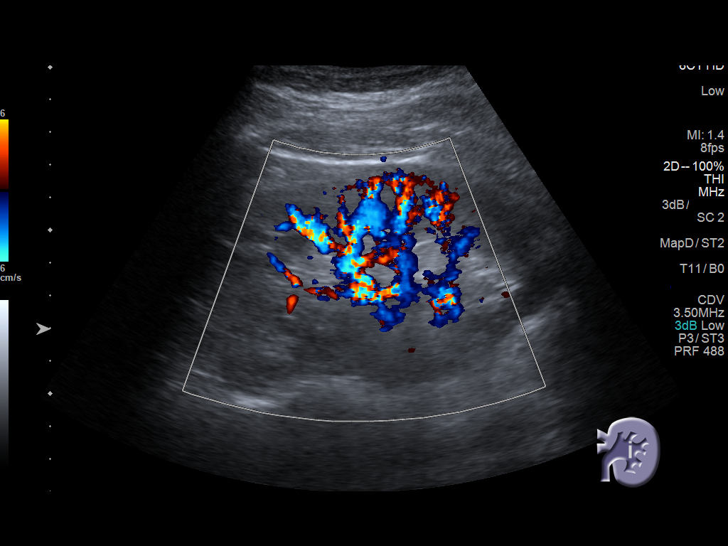
[im 27/44]
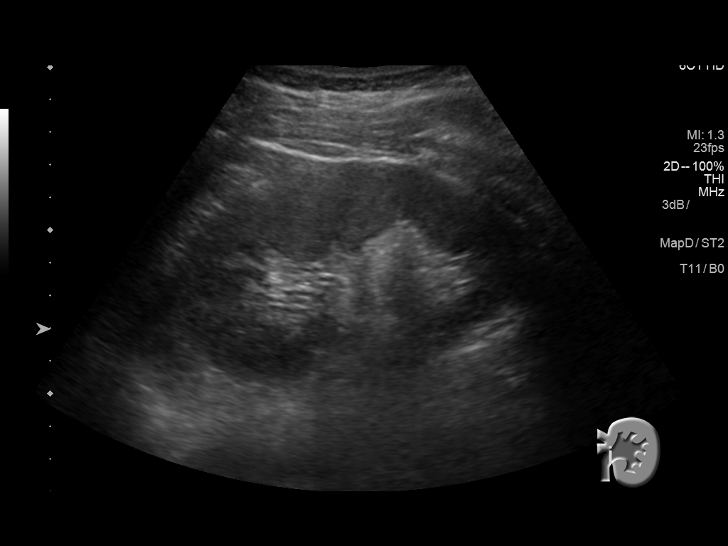
[im 29/44]
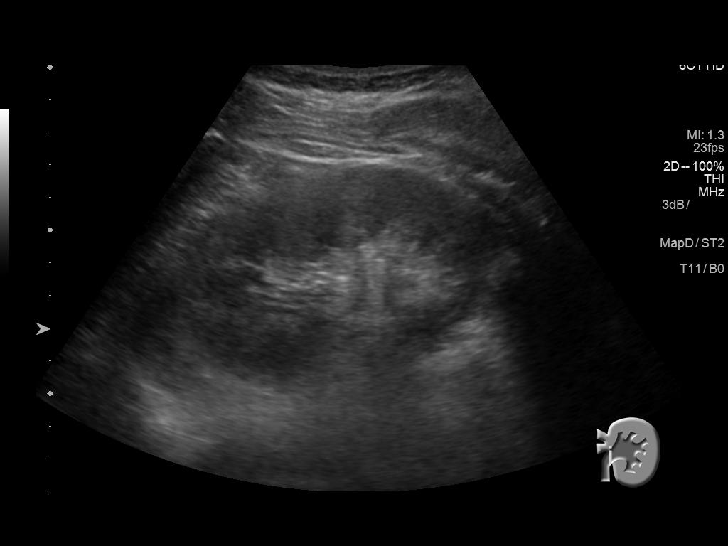
[im 33/44]
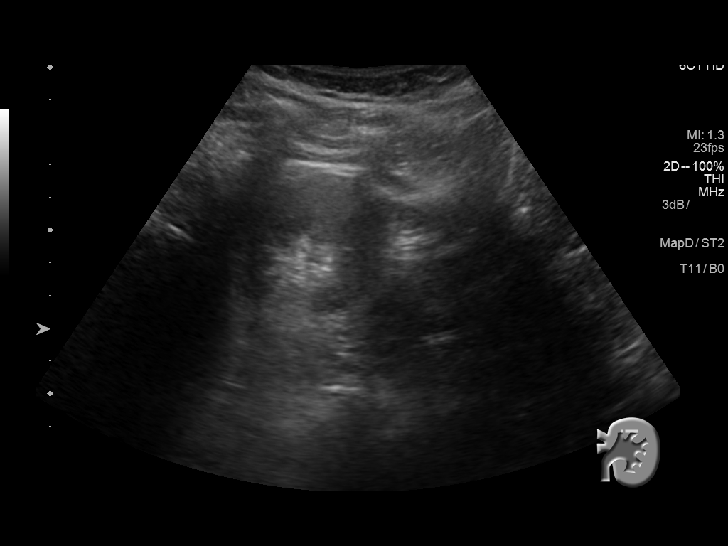
[im 36/44]
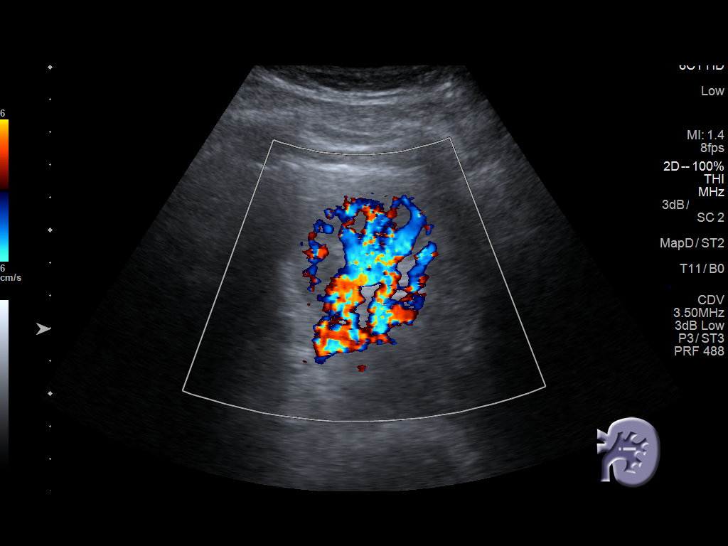
[im 40/44]
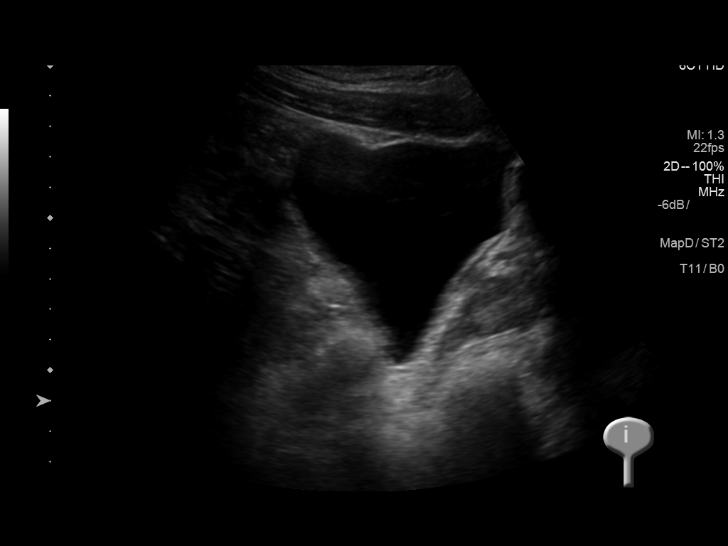
[im 44/44]
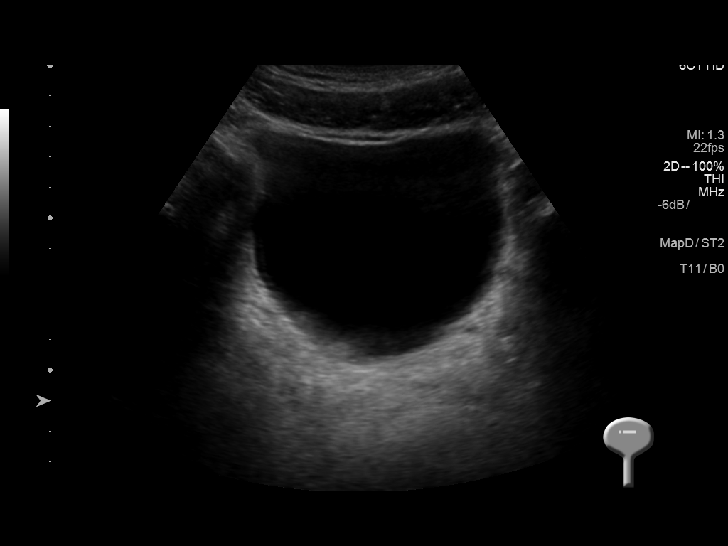

[14 of 25 positions shown; findings below may reference images not displayed]

FINDINGS: Right Kidney:

Length: 10.4 cm.. Echogenicity within normal limits. No mass or
hydronephrosis visualized.

Left Kidney:

Length: 12.0 cm.. Echogenicity within normal limits. No mass or
hydronephrosis visualized.

Bladder:

Appears normal for degree of bladder distention.
IMPRESSION: No acute abnormality is noted. The previously seen left UPJ stone is
not well visualized on this exam.

## 2017-11-29 ENCOUNTER — Ambulatory Visit: Payer: BLUE CROSS/BLUE SHIELD | Admitting: Urology

## 2017-11-29 ENCOUNTER — Encounter: Payer: Self-pay | Admitting: Urology

## 2017-11-29 VITALS — BP 127/84 | HR 73 | Ht 66.0 in | Wt 144.2 lb

## 2017-11-29 DIAGNOSIS — Z302 Encounter for sterilization: Secondary | ICD-10-CM | POA: Diagnosis not present

## 2017-11-29 DIAGNOSIS — Z87442 Personal history of urinary calculi: Secondary | ICD-10-CM | POA: Diagnosis not present

## 2017-11-29 MED ORDER — DIAZEPAM 10 MG PO TABS: 10.0000 mg | ORAL_TABLET | Freq: Once | ORAL | 0 refills | Status: AC

## 2017-11-29 NOTE — Progress Notes (Signed)
11/29/2017 5:42 PM   Russell DonathMark J Eaddy Jr. 10/02/1981 [id number]  Referring provider: Marisue IvanLinthavong, Kanhka, MD [phone] [facility] [address] [facility] [place], [place] [id number]  Chief Complaint  Patient presents with  . VAS Consult    HPI: 37 year old male who presents today for further evaluation of vasectomy.  He was previously known to our clinic for history of kidney stone seen several years ago.  He has 2 biological children ages 2410 and 775.  His wife physician is in having her IUD removed.  They are interested in other forms of birth control, specifically vasectomy.  They desire no further biological pregnancies.  He denies a history of testicular pain or trauma.  He has no urinary symptoms.  He had no further episodes of flank pain, hematuria, or stone episodes since his last visit nearly 2 years ago.   PMH: Past Medical History:  Diagnosis Date  . Anxiety   . Kidney stones     Surgical History: Past Surgical History:  Procedure Laterality Date  . MOUTH SURGERY      Home Medications:  Allergies as of 11/29/2017   No Known Allergies     Medication List        Accurate as of 11/29/17  5:42 PM. Always use your most recent med list.          diazepam 10 MG tablet Commonly known as:  VALIUM Take 1 tablet (10 mg total) by mouth once for 1 dose. Take 1 hour prior to procedure. Please have a driver available.   escitalopram 10 MG tablet Commonly known as:  LEXAPRO Take 1.5 tablets (15 mg total) by mouth daily.   hydrOXYzine 25 MG tablet Commonly known as:  ATARAX/VISTARIL TAKE 1 TABLET BY MOUTH FOUR TIMES A DAY AS NEEDED AS NEEDED FOR ANXIETY   LORazepam 0.5 MG tablet Commonly known as:  ATIVAN Take 1 tablet (0.5 mg total) by mouth 2 (two) times daily as needed for anxiety.       Allergies: No Known Allergies  Family History: Family History  Problem Relation Age of Onset  . Alcohol abuse Mother   . Anxiety disorder Mother   . Diabetes Father   .  Nephrolithiasis Father   . Kidney disease Sister   . Anxiety disorder Sister   . Prostate cancer Maternal Grandfather   . Bladder Cancer Neg Hx     Social History:  reports that he quit smoking about 13 years ago. He started smoking about 17 years ago. he has never used smokeless tobacco. He reports that he drinks about 6.0 - 8.4 oz of alcohol per week. He reports that he uses drugs. Drug: Marijuana.  ROS: UROLOGY Frequent Urination?: No Hard to postpone urination?: No Burning/pain with urination?: No Get up at night to urinate?: No Leakage of urine?: No Urine stream starts and stops?: No Trouble starting stream?: No Do you have to strain to urinate?: No Blood in urine?: No Urinary tract infection?: No Sexually transmitted disease?: No Injury to kidneys or bladder?: No Painful intercourse?: No Weak stream?: No Erection problems?: No Penile pain?: No  Gastrointestinal Nausea?: No Vomiting?: No Indigestion/heartburn?: No Diarrhea?: No Constipation?: No  Constitutional Fever: No Night sweats?: No Weight loss?: No Fatigue?: No  Skin Skin rash/lesions?: No Itching?: No  Eyes Blurred vision?: No Double vision?: No  Ears/Nose/Throat Sore throat?: No Sinus problems?: No  Hematologic/Lymphatic Swollen glands?: No Easy bruising?: No  Cardiovascular Leg swelling?: No Chest pain?: No  Respiratory Cough?: No Shortness  of breath?: No  Endocrine Excessive thirst?: No  Musculoskeletal Back pain?: No Joint pain?: No  Neurological Headaches?: No Dizziness?: No  Psychologic Depression?: No Anxiety?: Yes  Physical Exam: BP 127/84 (BP Location: Left Arm, Patient Position: Sitting, Cuff Size: Normal)   Pulse 73   Ht 5\' 6"  (1.676 m)   Wt 144 lb 3.2 oz (65.4 kg)   BMI 23.27 kg/m   Constitutional:  Alert and oriented, No acute distress. HEENT: [place] AT, moist mucus membranes.  Trachea midline, no masses. Cardiovascular: No clubbing, cyanosis, or  edema. Respiratory: Normal respiratory effort, no increased work of breathing. GU: Circumcised phallus.  Bilateral descended testicles, slightly atrophic bilaterally.  Vasa easily palpable. Skin: No rashes, bruises or suspicious lesions. Neurologic: Grossly intact, no focal deficits, moving all 4 extremities. Psychiatric: Normal mood and affect.  Laboratory Data: Lab Results  Component Value Date   WBC 6.5 09/01/2015   HGB 14.9 09/01/2015   HCT 43.4 09/01/2015   MCV 91.2 09/01/2015   PLT 258 09/01/2015    Lab Results  Component Value Date   CREATININE 1.06 09/01/2015    Urinalysis NA  Pertinent Imaging: n/a   Assessment & Plan:    1. Encounter for vasectomy Today, we discussed what the vas deferens is, where it is located, and its function. We reviewed the procedure for vasectomy, it's risks, benefits, alternatives, and likelihood of achieving his goals. We discussed in detail the procedure, complications, and recovery as well as the need for clearance prior to unprotected intercourse. We discussed that vasectomy does not protect against sexually transmitted diseases. We discussed that this procedure does not result in immediate sterility and that they would need to use other forms of birth control until he has been cleared with negative postvasectomy semen analyses. I explained that the procedure is considered to be permanent and that attempts at reversal have varying degrees of success. These options include vasectomy reversal, sperm retrieval, and in vitro fertilization; these can be very expensive. We discussed the chance of postvasectomy pain syndrome which occurs in less than 5% of patients. I explained to the patient that there is no treatment to resolve this chronic pain, and that if it developed I would not be able to help resolve the issue, but that surgery is generally not needed for correction. I explained there have even been reports of systemic like illness associated  with this chronic pain, and that there was no good cure. I explained that vasectomy it is not a 100% reliable form of birth control, and the risk of pregnancy after vasectomy is approximately 1 in 2000 men who had a negative postvasectomy semen analysis or rare non-motile sperm. I explained that repeat vasectomy was necessary in less than 1% of vasectomy procedures when employing the type of technique that I use. I explained that he should refrain from ejaculation for approximately one week following vasectomy. I explained that there are other options for birth control which are permanent and non-permanent; we discussed these. I explained the rates of surgical complications, such as symptomatic hematoma or infection, are low (1-2%) and vary with the surgeon's experience and criteria used to diagnose the complication.  The patient had the opportunity to ask questions to his stated satisfaction. He voiced understanding of the above factors and stated that he has read all the information provided to him and the packets and informed consent  He is interested in preprocedure Valium.  This was given today.  He will have a driver on the  day of the procedure.  His consent was signed today.  2. History of kidney stones Currently asymptomatic We will continue to follow clinically  Schedule vasectomy  Vanna Scotland, MD  [facility] [address] [place], [place] [address] [phone]

## 2017-12-09 ENCOUNTER — Ambulatory Visit (INDEPENDENT_AMBULATORY_CARE_PROVIDER_SITE_OTHER): Payer: BLUE CROSS/BLUE SHIELD | Admitting: Urology

## 2017-12-09 ENCOUNTER — Encounter: Payer: Self-pay | Admitting: Urology

## 2017-12-09 VITALS — BP 135/89 | HR 92 | Ht 66.0 in | Wt 140.0 lb

## 2017-12-09 DIAGNOSIS — Z302 Encounter for sterilization: Secondary | ICD-10-CM

## 2017-12-09 MED ORDER — PROMETHAZINE HCL 25 MG/ML IJ SOLN
25.0000 mg | Freq: Once | INTRAMUSCULAR | Status: AC
  Administered 2017-12-09: 25 mg via INTRAVENOUS

## 2017-12-09 MED ORDER — CEPHALEXIN 500 MG PO CAPS: 500.0000 mg | ORAL_CAPSULE | Freq: Four times a day (QID) | ORAL | 0 refills | Status: DC

## 2017-12-09 NOTE — Progress Notes (Signed)
12/09/17  CC:  Chief Complaint  Patient presents with  . VAS    HPI: 37 yo M who represents today for vasectomy.   He has 2 children ages 3010 and 625 and he and his wife desire no further biological pregnancies.  All risk and benefits were previously discussed.  Additional questions were answered today.  A timeout was performed prior to the procedure.  Blood pressure 135/89, pulse 92, height 5\' 6"  (1.676 m), weight 140 lb (63.5 kg). NED. A&Ox3.   No respiratory distress   Abd soft, NT, ND Normal external genitalia with patent urethral meatus   Bilateral Vasectomy Procedure  Pre-Procedure: - Patient's scrotum was prepped and draped for vasectomy. - The vas was palpated through the scrotal skin on the left. - 1% Xylocaine was injected into the skin and surrounding tissue for placement  - In a similar manner, the vas on the right was identified, anesthetized, and stabilized.  Procedure: -A #11 blade was used to make a small stab incision in the skin - The left vas was isolated and brought up through the incision exposing that structure. - Bleeding points were cauterized as they occurred. - The vas was free from the surrounding structures and brought to the view. - A segment was positioned for placement with a hemostat. - A second hemostat was placed and a small segment between the two hemostats and was removed for inspection. - Each end of the transected vas lumen was fulgurated/ obliterated using needlepoint electrocautery -A fascial interposition was performed on testicular end of the vas using #3-0 chromic suture -The same procedure was performed on the right. - A single suture of #3-0 chromic catgut was used to close each lateral scrotal skin incision - A dressing was applied.  Post-Procedure: - Patient was instructed in care of the operative area - A specimen is to be delivered in 12 weeks   -Another form of contraception is to be used until post vasectomy semen  analysis  During the procedure, the patient became nauseated and vomited.  He also reached down several times during the procedure near the wound.  As such, we will go ahead and prescribe Keflex 500 mg p.o. 4 times daily for 3 days as prophylaxis given my concern for infection due to possible contamination.  Vanna ScotlandAshley Chanceler Pullin, MD

## 2017-12-09 NOTE — Addendum Note (Signed)
Addended by: Honor LohGARRISON, Elwanda Moger M on: 12/09/2017 10:49 AM   Modules accepted: Orders

## 2018-01-17 ENCOUNTER — Ambulatory Visit: Payer: Self-pay | Admitting: Medical

## 2018-01-17 ENCOUNTER — Encounter: Payer: Self-pay | Admitting: Medical

## 2018-01-17 VITALS — BP 132/85 | HR 70 | Temp 98.5°F | Resp 16 | Ht 66.0 in | Wt 148.6 lb

## 2018-01-17 DIAGNOSIS — H04129 Dry eye syndrome of unspecified lacrimal gland: Secondary | ICD-10-CM

## 2018-01-17 DIAGNOSIS — H579 Unspecified disorder of eye and adnexa: Secondary | ICD-10-CM

## 2018-01-17 NOTE — Progress Notes (Signed)
   Subjective:    Patient ID: Russell DonathMark J Reister Jr., male    DOB: 07/07/1981, 37 y.o.   MRN: [id number]  HPI  37 yo in non acute distress. About one year ago using a weed eater and had something hit him in the left eye. Next day left eye was painful, next day after that minimal pain, so he did not think he needed to see anyone about his eye. Marland Kitchen.  He states he continues with eye sometimes  feeling of dryness ( like when he wakes in the morning) he then rubs it and the eye feels like it has a foreign body sensation,since the inury.  It still continues to be sensitive to his touch. Eye drops do seem to  help. He wonders if there is something deep in his eyeball. No blurry or double vision. Noticed a "a floater" 2 months ago.  Last eye exam he cannot recall.   Review of Systems  Constitutional: Negative for chills and fever.  HENT: Negative for congestion.   Respiratory: Negative for cough and shortness of breath.   Cardiovascular: Negative for chest pain.  Gastrointestinal: Negative for abdominal pain.  Genitourinary: Negative for dysuria.  Musculoskeletal: Negative for myalgias.  Skin: Negative for rash.  Neurological: Negative for dizziness, syncope and light-headedness.  Hematological: Negative for adenopathy.  Psychiatric/Behavioral: Negative for behavioral problems, self-injury and suicidal ideas. The patient is not nervous/anxious.        Objective:   Physical Exam  Constitutional: He is oriented to person, place, and time. He appears well-developed and well-nourished.  HENT:  Head: Normocephalic and atraumatic.  Eyes: Conjunctivae, EOM and lids are normal. Pupils are equal, round, and reactive to light. Right eye exhibits no discharge. Left eye exhibits no chemosis, no discharge, no exudate and no hordeolum. No foreign body present in the left eye. Left conjunctiva is not injected. Left conjunctiva has no hemorrhage. No scleral icterus. Left eye exhibits normal extraocular motion.   Fundoscopic exam:      The left eye shows red reflex.  Neurological: He is alert and oriented to person, place, and time.  Skin: Skin is warm and dry.  Psychiatric: He has a normal mood and affect. His behavior is normal. Judgment and thought content normal.  Nursing note and vitals reviewed.  fluourescein stain no dye uptake. Nothing visible under lid of left eye.  Visual acuity 20/20 left 20/30 right 20/20 both    Assessment & Plan:  Left eye foreign body sensation x 1 year. Will schedule eye exam with [facility].

## 2018-04-04 DIAGNOSIS — Z9852 Vasectomy status: Secondary | ICD-10-CM | POA: Insufficient documentation

## 2019-08-01 ENCOUNTER — Ambulatory Visit: Payer: Self-pay

## 2019-08-01 ENCOUNTER — Other Ambulatory Visit: Payer: Self-pay

## 2019-08-01 DIAGNOSIS — Z23 Encounter for immunization: Secondary | ICD-10-CM

## 2019-11-10 ENCOUNTER — Other Ambulatory Visit: Payer: Self-pay

## 2019-11-10 ENCOUNTER — Encounter: Payer: Self-pay | Admitting: Emergency Medicine

## 2019-11-10 DIAGNOSIS — R197 Diarrhea, unspecified: Secondary | ICD-10-CM | POA: Diagnosis not present

## 2019-11-10 DIAGNOSIS — F17200 Nicotine dependence, unspecified, uncomplicated: Secondary | ICD-10-CM | POA: Insufficient documentation

## 2019-11-10 DIAGNOSIS — R1032 Left lower quadrant pain: Secondary | ICD-10-CM | POA: Diagnosis present

## 2019-11-10 LAB — COMPREHENSIVE METABOLIC PANEL
ALT: 51 U/L — ABNORMAL HIGH (ref 0–44)
AST: 45 U/L — ABNORMAL HIGH (ref 15–41)
Albumin: 4.6 g/dL (ref 3.5–5.0)
Alkaline Phosphatase: 56 U/L (ref 38–126)
Anion gap: 11 (ref 5–15)
BUN: 12 mg/dL (ref 6–20)
CO2: 25 mmol/L (ref 22–32)
Calcium: 9.1 mg/dL (ref 8.9–10.3)
Chloride: 103 mmol/L (ref 98–111)
Creatinine, Ser: 0.76 mg/dL (ref 0.61–1.24)
GFR calc Af Amer: >60 mL/min (ref >60)
GFR calc non Af Amer: >60 mL/min (ref >60)
Glucose, Bld: 95 mg/dL (ref 70–99)
Potassium: 3.7 mmol/L (ref 3.5–5.1)
Sodium: 139 mmol/L (ref 135–145)
Total Bilirubin: 0.8 mg/dL (ref 0.3–1.2)
Total Protein: 8 g/dL (ref 6.5–8.1)

## 2019-11-10 LAB — URINALYSIS, COMPLETE (UACMP) WITH MICROSCOPIC
Bacteria, UA: NONE SEEN
Bilirubin Urine: NEGATIVE
Glucose, UA: NEGATIVE mg/dL
Hgb urine dipstick: NEGATIVE
Ketones, ur: 5 mg/dL — AB
Leukocytes,Ua: NEGATIVE
Nitrite: NEGATIVE
Protein, ur: NEGATIVE mg/dL
Specific Gravity, Urine: 1.017 (ref 1.005–1.030)
Squamous Epithelial / HPF: NONE SEEN (ref 0–5)
WBC, UA: NONE SEEN WBC/hpf (ref 0–5)
pH: 7 (ref 5.0–8.0)

## 2019-11-10 LAB — LIPASE, BLOOD: Lipase: 28 U/L (ref 11–51)

## 2019-11-10 LAB — CBC
HCT: 44.7 % (ref 39.0–52.0)
Hemoglobin: 15.2 g/dL (ref 13.0–17.0)
MCH: 31.3 pg (ref 26.0–34.0)
MCHC: 34 g/dL (ref 30.0–36.0)
MCV: 92.2 fL (ref 80.0–100.0)
Platelets: 268 10*3/uL (ref 150–400)
RBC: 4.85 MIL/uL (ref 4.22–5.81)
RDW: 12.2 % (ref 11.5–15.5)
WBC: 8.2 10*3/uL (ref 4.0–10.5)
nRBC: 0 % (ref 0.0–0.2)

## 2019-11-10 MED ORDER — ONDANSETRON HCL 4 MG/2ML IJ SOLN
4.0000 mg | Freq: Once | INTRAMUSCULAR | Status: AC
  Administered 2019-11-10: 4 mg via INTRAVENOUS
  Filled 2019-11-10: qty 2

## 2019-11-10 MED ORDER — FENTANYL CITRATE (PF) 100 MCG/2ML IJ SOLN
50.0000 ug | INTRAMUSCULAR | Status: DC | PRN
  Administered 2019-11-10: 50 ug via INTRAVENOUS
  Filled 2019-11-10: qty 2

## 2019-11-10 NOTE — ED Triage Notes (Addendum)
Pt arrived via POV with reports of mid-abdominal pain for the past few days, pt states the pain goes down the center of his abdomen down to pelvic area and radiating back to right flank.  Pt states the pain is worse with movement.   Pt reports he has had some diarrhea, but has been going on before these sxs started.   Pt reports hx of kidney stones several years ago but states the pain feels different

## 2019-11-11 ENCOUNTER — Encounter: Payer: Self-pay | Admitting: Radiology

## 2019-11-11 ENCOUNTER — Emergency Department
Admission: EM | Admit: 2019-11-11 | Discharge: 2019-11-11 | Disposition: A | Discharge status: Home / Self Care | Payer: BC Managed Care – PPO | Attending: Emergency Medicine | Admitting: Emergency Medicine

## 2019-11-11 ENCOUNTER — Emergency Department: Payer: BC Managed Care – PPO

## 2019-11-11 DIAGNOSIS — R1032 Left lower quadrant pain: Secondary | ICD-10-CM

## 2019-11-11 DIAGNOSIS — K529 Noninfective gastroenteritis and colitis, unspecified: Secondary | ICD-10-CM

## 2019-11-11 MED ORDER — DICYCLOMINE HCL 10 MG PO CAPS: 10.0000 mg | ORAL_CAPSULE | Freq: Three times a day (TID) | ORAL | 0 refills | Status: AC | PRN

## 2019-11-11 MED ORDER — IOHEXOL 300 MG/ML  SOLN
100.0000 mL | Freq: Once | INTRAMUSCULAR | Status: AC | PRN
  Administered 2019-11-11: 100 mL via INTRAVENOUS

## 2019-11-11 NOTE — ED Notes (Addendum)
Pt to room from CT att, pt was "clicked" into room earlier but is now in room for for first time  Pt reports diffuse abdominal pain   Pt reports 2-3 months of LLQ discomfort, reports standing and walking making discomfort worse  Last BM yesterday am, and soft/loose for the last 2-3 months, mother has diverticulitis

## 2019-11-11 NOTE — Discharge Instructions (Signed)
As we discussed, your work-up today was generally reassuring with no evidence of an emergent medical condition.  However, given the duration of your symptoms, I encourage you to follow-up with Dr. Norma Fredrickson or another gastroenterologist.  Please call the office and let them know you were seen in the emergency department and that the emergency department physician recommended you be seen by a gastroenterologist to determine if additional evaluation is recommended including for the possibility of inflammatory bowel disease (IBD).  Please try taking the medication I prescribed to see if it helps with your symptoms.  You can also follow-up with your primary care provider about the issues we discussed.  Return to the emergency department if you develop new or worsening symptoms that concern you.

## 2019-11-11 NOTE — ED Provider Notes (Addendum)
[facility] Department Provider Note  ____________________________________________   First MD Initiated Contact with Patient 11/11/19 [phone]     (approximate)  I have reviewed the triage vital signs and the nursing notes.   HISTORY  Chief Complaint Abdominal Pain    HPI Russell Rodgers. is a 39 y.o. male with medical history as listed below who presents for evaluation of 2 to 3 months of left lower quadrant abdominal discomfort that comes and goes but occasionally feels like a "fluttering" as well as some dull aching pain.  When it is occurring, standing and walking makes it worse.  Nothing in particular makes it better but it goes away at times.  He has also been having loose stool/diarrhea for 2 to 3 months.  He denies nausea or vomiting.  He denies fever/chills, chest pain, shortness of breath.  He has never been evaluated by a GI doctor and he has no family history of inflammatory bowel disease.  He said that his symptoms were worse tonight with a sensation of fluttering, warmth, and discomfort in his left lower abdomen so he came to the ED for evaluation.  He denies dysuria and urinary hesitancy.  No pain with defecation.         Past Medical History:  Diagnosis Date  . Anxiety   . Kidney stones     Patient Active Problem List   Diagnosis Date Noted  . Pain in the chest 07/15/2015  . Palpitations 07/15/2015  . Vasovagal syncope 07/15/2015  . Anxiety attack 07/08/2015  . Other specified counseling 07/08/2015    Past Surgical History:  Procedure Laterality Date  . MOUTH SURGERY      Prior to Admission medications   Medication Sig Start Date End Date Taking? Authorizing Provider  diazepam (VALIUM) 10 MG tablet TAKE 1 TABLET BY MOUTH ONCE FOR 1 DOSE. TAKE 1 HOUR PRIOR TO PROCEDURE. PLEASE HAVE A DRIVER 3/79/02   [provider]  dicyclomine (BENTYL) 10 MG capsule Take 1 capsule (10 mg total) by mouth 3 (three) times daily as  needed for up to 14 days for spasms. or abdominal pain 11/11/19 11/25/19  Loleta Rose, MD  escitalopram (LEXAPRO) 10 MG tablet Take 1.5 tablets (15 mg total) by mouth daily. 09/18/15 12/22/15  Kerin Salen, MD  hydrOXYzine (ATARAX/VISTARIL) 25 MG tablet TAKE 1 TABLET BY MOUTH FOUR TIMES A DAY AS NEEDED AS NEEDED FOR ANXIETY 07/09/15   [provider]  LORazepam (ATIVAN) 0.5 MG tablet Take 1 tablet (0.5 mg total) by mouth 2 (two) times daily as needed for anxiety. 10/13/15   Kerin Salen, MD    Allergies Patient has no known allergies.  Family History  Problem Relation Age of Onset  . Alcohol abuse Mother   . Anxiety disorder Mother   . Diabetes Father   . Nephrolithiasis Father   . Kidney disease Sister   . Anxiety disorder Sister   . Prostate cancer Maternal Grandfather   . Bladder Cancer Neg Hx     Social History Social History   Tobacco Use  . Smoking status: Current Some Day Smoker    Start date: 05/07/2000    Last attempt to quit: 05/07/2004    Years since quitting: 15.5  . Smokeless tobacco: Never Used  Substance Use Topics  . Alcohol use: Yes    Alcohol/week: 10.0 - 14.0 standard drinks    Types: 7 Cans of beer, 3 - 7 Shots of liquor per week  .  Drug use: Yes    Types: Marijuana    Comment: last used last weekend  09-13-15    Review of Systems Constitutional: No fever/chills Eyes: No visual changes. ENT: No sore throat. Cardiovascular: Denies chest pain. Respiratory: Denies shortness of breath. Gastrointestinal: 2 to 3 months of left lower quadrant abdominal discomfort (intermittent) and diarrhea. Genitourinary: Negative for dysuria, urinary hesitancy, or increased urinary frequency. Musculoskeletal: Negative for neck pain.  Negative for back pain. Integumentary: Negative for rash. Neurological: Negative for headaches, focal weakness or numbness.   ____________________________________________   PHYSICAL EXAM:  VITAL SIGNS: ED Triage Vitals   Enc Vitals Group     BP 11/10/19 2017 (!) 126/91     Pulse Rate 11/10/19 2017 90     Resp 11/10/19 2017 18     Temp 11/10/19 2017 98 F (36.7 C)     Temp Source 11/10/19 2017 Oral     SpO2 11/10/19 2017 99 %     Weight 11/10/19 2013 63.5 kg (140 lb)     Height 11/10/19 2013 1.676 m (5\' 6" )     Head Circumference --      Peak Flow --      Pain Score 11/10/19 2013 5     Pain Loc --      Pain Edu? --      Excl. in GC? --     Constitutional: Alert and oriented.  Generally healthy and well-appearing, no acute distress. Eyes: Conjunctivae are normal.  Head: Atraumatic. Nose: No congestion/rhinnorhea. Mouth/Throat: Patient is wearing a mask. Neck: No stridor.  No meningeal signs.   Cardiovascular: Normal rate, regular rhythm. Good peripheral circulation. Grossly normal heart sounds. Respiratory: Normal respiratory effort.  No retractions. Gastrointestinal: Soft and nontender. No distention.  Musculoskeletal: No lower extremity tenderness nor edema. No gross deformities of extremities. Neurologic:  Normal speech and language. No gross focal neurologic deficits are appreciated.  Skin:  Skin is warm, dry and intact. Psychiatric: Mood and affect are normal. Speech and behavior are normal.  ____________________________________________   LABS (all labs ordered are listed, but only abnormal results are displayed)  Labs Reviewed  COMPREHENSIVE METABOLIC PANEL - Abnormal; Notable for the following components:      Result Value   AST 45 (*)    ALT 51 (*)    All other components within normal limits  URINALYSIS, COMPLETE (UACMP) WITH MICROSCOPIC - Abnormal; Notable for the following components:   Color, Urine YELLOW (*)    APPearance CLEAR (*)    Ketones, ur 5 (*)    All other components within normal limits  LIPASE, BLOOD  CBC   ____________________________________________  EKG  ED ECG REPORT I, 01/08/20, the attending physician, personally viewed and interpreted this  ECG.  Date: 11/10/2019 EKG Time: 20:18 Rate: 75 Rhythm: normal sinus rhythm QRS Axis: normal Intervals: normal ST/T Wave abnormalities: normal Narrative Interpretation: no evidence of acute ischemia  ____________________________________________  RADIOLOGY I, 01/08/2020, personally viewed and evaluated these images (plain radiographs) as part of my medical decision making, as well as reviewing the written report by the radiologist.  ED MD interpretation: No acute/emergent abnormalities on CT abdomen/pelvis.  Evidence of slightly enlarged prostate.  Official radiology report(s): CT abd/pelvis w/ IV contrast  Result Date: 11/11/2019 CLINICAL DATA:  Right lower quadrant abdominal pain EXAM: CT ABDOMEN AND PELVIS WITH CONTRAST TECHNIQUE: Multidetector CT imaging of the abdomen and pelvis was performed using the standard protocol following bolus administration of intravenous contrast. CONTRAST:  01/09/2020 OMNIPAQUE  IOHEXOL 300 MG/ML  SOLN COMPARISON:  CT 09/01/2015 FINDINGS: Lower chest: Lung bases demonstrate no acute consolidation or effusion. Heart size is within normal limits. Hepatobiliary: No focal liver abnormality is seen. No gallstones, gallbladder wall thickening, or biliary dilatation. Pancreas: Unremarkable. No pancreatic ductal dilatation or surrounding inflammatory changes. Spleen: Normal in size without focal abnormality. Adrenals/Urinary Tract: Adrenal glands are unremarkable. Kidneys are normal, without renal calculi, focal lesion, or hydronephrosis. Bladder is unremarkable. Stomach/Bowel: Stomach is within normal limits. Appendix appears normal. No evidence of bowel wall thickening, distention, or inflammatory changes. Vascular/Lymphatic: No significant vascular findings are present. No enlarged abdominal or pelvic lymph nodes. Reproductive: Heterogenous prostate enhancement with punctate calcification Other: Negative for free air or free fluid Musculoskeletal: No acute or significant  osseous findings. IMPRESSION: 1. No CT evidence for acute intra-abdominal or pelvic abnormality. Negative for acute appendicitis 2. Heterogenous, slightly enlarged appearing prostate Electronically Signed   By: Jasmine Pang M.D.   On: 11/11/2019 01:38    ____________________________________________   PROCEDURES   Procedure(s) performed (including Critical Care):  Procedures   ____________________________________________   INITIAL IMPRESSION / MDM / ASSESSMENT AND PLAN / ED COURSE  As part of my medical decision making, I reviewed the following data within the electronic MEDICAL RECORD NUMBER Nursing notes reviewed and incorporated, Labs reviewed , Old chart reviewed and Notes from prior ED visits   Differential diagnosis includes, but is not limited to, inflammatory bowel disease, IBS, GI infection (viral versus bacterial versus parasitic), diverticulitis, nonspecific colitis or enteritis, appendicitis.  Patient is well-appearing in no distress.  Normal and stable vital signs, afebrile.  No leukocytosis.  Normal metabolic panel except for very slightly elevated LFTs which are nonspecific and about which I informed the patient but not requiring further work-up.  Urinalysis is normal other than some very minimal ketones.  Normal lipase.  CT scan shows a possibly enlarged prostate but otherwise unremarkable.  I discussed the reassuring results with the patient but I encourage close outpatient follow-up with GI.  His symptoms could be the result of inflammatory bowel disease and I believe that he would benefit from a gastroenterology follow-up.  I also suggested he discuss the possible enlarged but essentially asymptomatic prostate with his primary care provider.  He understands and agrees with the plan.  I am giving him a prescription for Bentyl to see if that helps with his intermittent GI symptoms.  I gave my usual and customary return precautions.             ____________________________________________  FINAL CLINICAL IMPRESSION(S) / ED DIAGNOSES  Final diagnoses:  LLQ discomfort  Chronic diarrhea     MEDICATIONS GIVEN DURING THIS VISIT:  Medications  fentaNYL (SUBLIMAZE) injection 50 mcg (50 mcg Intravenous Given 11/10/19 2036)  ondansetron (ZOFRAN) injection 4 mg (4 mg Intravenous Given 11/10/19 2036)  iohexol (OMNIPAQUE) 300 MG/ML solution 100 mL (100 mLs Intravenous Contrast Given 11/11/19 0116)     ED Discharge Orders         Ordered    dicyclomine (BENTYL) 10 MG capsule  3 times daily PRN     11/11/19 0225          *Please note:  Russell Rodgers. was evaluated in Emergency Department on 11/11/2019 for the symptoms described in the history of present illness. He was evaluated in the context of the global COVID-19 pandemic, which necessitated consideration that the patient might be at risk for infection with the SARS-CoV-2 virus that causes COVID-19. Institutional  protocols and algorithms that pertain to the evaluation of patients at risk for COVID-19 are in a state of rapid change based on information released by regulatory bodies including the CDC and federal and state organizations. These policies and algorithms were followed during the patient's care in the ED.  Some ED evaluations and interventions may be delayed as a result of limited staffing during the pandemic.*  Note:  This document was prepared using Dragon voice recognition software and may include unintentional dictation errors.   Hinda Kehr, MD 11/11/19 Reece Agar    Hinda Kehr, MD 12/11/19 0330

## 2019-11-11 NOTE — ED Notes (Signed)
Peripheral IV discontinued. Catheter intact. No signs of infiltration or redness. Gauze applied to IV site.   Discharge instructions reviewed with patient. Questions fielded by this RN. Patient verbalizes understanding of instructions. Patient discharged home in stable condition per forbach. No acute distress noted at time of discharge.   Pt ambulatory to lobby for family to pick up

## 2020-11-20 ENCOUNTER — Other Ambulatory Visit: Payer: Self-pay

## 2020-11-20 ENCOUNTER — Ambulatory Visit: Payer: Self-pay

## 2020-11-20 ENCOUNTER — Telehealth: Payer: Self-pay | Admitting: Nurse Practitioner

## 2020-11-20 DIAGNOSIS — J069 Acute upper respiratory infection, unspecified: Secondary | ICD-10-CM

## 2020-11-20 DIAGNOSIS — Z20822 Contact with and (suspected) exposure to covid-19: Secondary | ICD-10-CM

## 2020-11-20 LAB — POC COVID19 BINAXNOW: SARS Coronavirus 2 Ag: NEGATIVE

## 2020-11-20 NOTE — Progress Notes (Signed)
   Subjective:    Patient ID: Russell Rodgers., male    DOB: 1981/05/14, 40 y.o.   MRN: [id number]  HPI  40 year old male presenting to clinic today with symptoms  He has been vaccinated for COVID-19 with a booster  His son tested positive 11/17/20 with a home test  Wife has pending PCR  Patient has a cough/ headache and fatigue 11/19/2020  Pule 89 SpO2 100%  [phone]    Review of Systems     Objective:   Physical Exam  This was a telehealth appointment with patient after nurse visit for COVID-19 testing   He was in no acute distress during phone conversation  Recent Results (from the past 2160 hour(s))  POC COVID-19     Status: Normal   Collection Time: 11/20/20 11:45 AM  Result Value Ref Range   SARS Coronavirus 2 Ag Negative Negative    Comment: Vaccinated, boosted, symptomatic patient. Aware of negative POC results. Has telephone appt with Lavone Neri FNP for further evaluation.      Assessment & Plan:  Will send out PCR testing to confirm negative rapid testing today with symptoms  Patient will isolate until PCR results return   Return to work with positive testing will be 11/25/20 if symptoms are improving at that time  He will otherwise return to clinic as needed for ongoing or worsening symptoms as discussed.

## 2020-11-23 ENCOUNTER — Telehealth: Payer: Self-pay | Admitting: Nurse Practitioner

## 2020-11-23 ENCOUNTER — Encounter: Payer: Self-pay | Admitting: Nurse Practitioner

## 2020-11-23 DIAGNOSIS — U071 COVID-19: Secondary | ICD-10-CM

## 2020-11-23 LAB — SARS-COV-2, NAA 2 DAY TAT

## 2020-11-23 LAB — NOVEL CORONAVIRUS, NAA: SARS-CoV-2, NAA: DETECTED — AB

## 2020-11-23 NOTE — Telephone Encounter (Signed)
    Called patient with lab results presented for COVID testing 11/20/20 as close contact with son, rapid testing was negative at that time. PCR returning today was positive.  Will notify HR and [employer] Return to work will be 11/25/20 as long as no new symptoms and current symptoms are improving. Diligent mask wearing for 10 days through 11/30/20.  No further recommedation for treatment at this time, monocloncal infusions and antiviral treatments are currently only indicated for non vaccinated patients.   Patient to report to ED or seek medical attention for any new or acutely worsening symptoms.   OTC decongestants may be used to manage minor nasal congestion as discussed.   Recent Results (from the past 2160 hour(s))  POC COVID-19     Status: Normal   Collection Time: 11/20/20 11:45 AM  Result Value Ref Range   SARS Coronavirus 2 Ag Negative Negative    Comment: Vaccinated, boosted, symptomatic patient. Aware of negative POC results. Has telephone appt with Lavone Neri FNP for further evaluation.  Novel Coronavirus, NAA ([facility])     Status: Abnormal   Collection Time: 11/20/20 11:50 AM   Specimen: Nasopharyngeal(NP) swabs in vial transport medium   Nasopharynge  Result Value Ref Range   SARS-CoV-2, NAA Detected (A) Not Detected    Comment: Patients who have a positive COVID-19 test result may now have treatment options. Treatment options are available for patients with mild to moderate symptoms and for hospitalized patients. Visit our website at [email or web address] for resources and information. This nucleic acid amplification test was developed and its performance characteristics determined by [employer]. Nucleic acid amplification tests include RT-PCR and TMA. This test has not been FDA cleared or approved. This test has been authorized by FDA under an Emergency Use Authorization (EUA). This test is only authorized for the duration of time the declaration that  circumstances exist justifying the authorization of the emergency use of in vitro diagnostic tests for detection of SARS-CoV-2 virus and/or diagnosis of COVID-19 infection under section 564(b)(1) of the Act, 21 U.S.C. 258NID-7(O) (1), unless the authorization is terminated or revoked sooner. When diagnostic testing is negativ e, the possibility of a false negative result should be considered in the context of a patient's recent exposures and the presence of clinical signs and symptoms consistent with COVID-19. An individual without symptoms of COVID-19 and who is not shedding SARS-CoV-2 virus would expect to have a negative (not detected) result in this assay.   SARS-COV-2, NAA 2 DAY TAT     Status: None   Collection Time: 11/20/20 11:50 AM   Nasopharynge  Result Value Ref Range   SARS-CoV-2, NAA 2 DAY TAT Performed

## 2020-11-25 ENCOUNTER — Encounter: Payer: Self-pay | Admitting: Emergency Medicine

## 2020-11-25 ENCOUNTER — Other Ambulatory Visit: Payer: Self-pay

## 2020-11-25 ENCOUNTER — Emergency Department: Payer: BC Managed Care – PPO

## 2020-11-25 ENCOUNTER — Emergency Department
Admission: EM | Admit: 2020-11-25 | Discharge: 2020-11-25 | Disposition: A | Discharge status: Home / Self Care | Payer: BC Managed Care – PPO | Attending: Emergency Medicine | Admitting: Emergency Medicine

## 2020-11-25 DIAGNOSIS — R002 Palpitations: Secondary | ICD-10-CM | POA: Insufficient documentation

## 2020-11-25 DIAGNOSIS — U071 COVID-19: Secondary | ICD-10-CM | POA: Diagnosis not present

## 2020-11-25 DIAGNOSIS — R42 Dizziness and giddiness: Secondary | ICD-10-CM | POA: Diagnosis not present

## 2020-11-25 DIAGNOSIS — F17200 Nicotine dependence, unspecified, uncomplicated: Secondary | ICD-10-CM | POA: Insufficient documentation

## 2020-11-25 DIAGNOSIS — F1590 Other stimulant use, unspecified, uncomplicated: Secondary | ICD-10-CM | POA: Diagnosis not present

## 2020-11-25 DIAGNOSIS — Z7141 Alcohol abuse counseling and surveillance of alcoholic: Secondary | ICD-10-CM | POA: Diagnosis not present

## 2020-11-25 DIAGNOSIS — R000 Tachycardia, unspecified: Secondary | ICD-10-CM | POA: Diagnosis not present

## 2020-11-25 DIAGNOSIS — R079 Chest pain, unspecified: Secondary | ICD-10-CM | POA: Diagnosis present

## 2020-11-25 LAB — BASIC METABOLIC PANEL
Anion gap: 13 (ref 5–15)
BUN: 14 mg/dL (ref 6–20)
CO2: 26 mmol/L (ref 22–32)
Calcium: 10.1 mg/dL (ref 8.9–10.3)
Chloride: 100 mmol/L (ref 98–111)
Creatinine, Ser: 0.71 mg/dL (ref 0.61–1.24)
GFR, Estimated: >60 mL/min (ref >60)
Glucose, Bld: 101 mg/dL — ABNORMAL HIGH (ref 70–99)
Potassium: 4.3 mmol/L (ref 3.5–5.1)
Sodium: 139 mmol/L (ref 135–145)

## 2020-11-25 LAB — CBC
HCT: 46.1 % (ref 39.0–52.0)
Hemoglobin: 15.8 g/dL (ref 13.0–17.0)
MCH: 31.7 pg (ref 26.0–34.0)
MCHC: 34.3 g/dL (ref 30.0–36.0)
MCV: 92.6 fL (ref 80.0–100.0)
Platelets: 224 10*3/uL (ref 150–400)
RBC: 4.98 MIL/uL (ref 4.22–5.81)
RDW: 12.1 % (ref 11.5–15.5)
WBC: 4.7 10*3/uL (ref 4.0–10.5)
nRBC: 0 % (ref 0.0–0.2)

## 2020-11-25 LAB — FIBRIN DERIVATIVES D-DIMER (ARMC ONLY): Fibrin derivatives D-dimer (ARMC): 256.79 ng/mL (FEU) (ref 0.00–499.00)

## 2020-11-25 LAB — TSH: TSH: 0.762 u[IU]/mL (ref 0.350–4.500)

## 2020-11-25 LAB — TROPONIN I (HIGH SENSITIVITY)
Troponin I (High Sensitivity): 3 ng/L (ref <18)
Troponin I (High Sensitivity): 2 ng/L (ref <18)

## 2020-11-25 LAB — MAGNESIUM: Magnesium: 2.1 mg/dL (ref 1.7–2.4)

## 2020-11-25 MED ORDER — LACTATED RINGERS IV BOLUS: 1000.0000 mL | Freq: Once | INTRAVENOUS | Status: DC

## 2020-11-25 NOTE — ED Triage Notes (Signed)
C/O 3 week history of intermittent palpitations.  Today, patient also c/o lightheadedness.  States tested positive for COVID on 11/20/2020.  AAOx3.  Skin warm and dry. NAD.  Patient appears anxious.

## 2020-11-25 NOTE — Progress Notes (Signed)
Yes done 

## 2020-11-25 NOTE — ED Provider Notes (Signed)
[facility] Emergency Department Provider Note  ____________________________________________   Event Date/Time   First MD Initiated Contact with Patient 11/25/20 1619     (approximate)  I have reviewed the triage vital signs and the nursing notes.   HISTORY  Chief Complaint Chest Pain and Palpitations   HPI Russell Grinder. is a 40 y.o. male with a past medical history of anxiety, kidney stones, and EtOH abuse approximately 3 alcoholic beverages per day who presents for assessment of proximately 3 weeks of palpitations as well as recent diagnosis of COVID-19 on 1/13 and some lightheadedness he experienced today.  Patient states he initially had some cough and congestion associated to COVID-19 diagnosis but this is since resolved.  He states the palpitations have been unchanged and, go and that lightheadedness is only seemingly new symptoms today.  He denies any headache, vision changes, vertigo, earache, sore throat, nausea, vomiting, diarrhea, dysuria, rash, chest pain, recent cough, shortness of breath, back pain, abdominal pain, or acute extremity symptoms.  No clear alleviating aggravating factors.  No prior similar episodes.  Patient states he has not noticed any change in his p.o. intake or urine output.  No recent falls or injuries.  He denies tobacco use or illicit drug use.          Past Medical History:  Diagnosis Date  . Anxiety   . Kidney stones     Patient Active Problem List   Diagnosis Date Noted  . Pain in the chest 07/15/2015  . Palpitations 07/15/2015  . Vasovagal syncope 07/15/2015  . Anxiety attack 07/08/2015  . Other specified counseling 07/08/2015    Past Surgical History:  Procedure Laterality Date  . MOUTH SURGERY      Prior to Admission medications   Medication Sig Start Date End Date Taking? Authorizing Provider  dicyclomine (BENTYL) 10 MG capsule Take 1 capsule (10 mg total) by mouth 3 (three) times daily as needed  for up to 14 days for spasms. or abdominal pain 11/11/19 11/25/19  Loleta Rose, MD  escitalopram (LEXAPRO) 10 MG tablet Take 1.5 tablets (15 mg total) by mouth daily. 09/18/15 12/22/15  Kerin Salen, MD  hydrOXYzine (ATARAX/VISTARIL) 25 MG tablet TAKE 1 TABLET BY MOUTH FOUR TIMES A DAY AS NEEDED AS NEEDED FOR ANXIETY 07/09/15   [provider]    Allergies Patient has no known allergies.  Family History  Problem Relation Age of Onset  . Alcohol abuse Mother   . Anxiety disorder Mother   . Diabetes Father   . Nephrolithiasis Father   . Kidney disease Sister   . Anxiety disorder Sister   . Prostate cancer Maternal Grandfather   . Bladder Cancer Neg Hx     Social History Social History   Tobacco Use  . Smoking status: Current Some Day Smoker    Start date: 05/07/2000    Last attempt to quit: 05/07/2004    Years since quitting: 16.5  . Smokeless tobacco: Never Used  Vaping Use  . Vaping Use: Never used  Substance Use Topics  . Alcohol use: Yes    Alcohol/week: 10.0 - 14.0 standard drinks    Types: 7 Cans of beer, 3 - 7 Shots of liquor per week  . Drug use: Yes    Types: Marijuana    Comment: last used last weekend  09-13-15    Review of Systems  Review of Systems  Constitutional: Negative for chills and fever.  HENT: Negative for sore throat.  Eyes: Negative for pain.  Respiratory: Negative for cough and stridor.   Cardiovascular: Positive for palpitations. Negative for chest pain.  Gastrointestinal: Negative for vomiting.  Genitourinary: Negative for dysuria.  Musculoskeletal: Negative for myalgias.  Skin: Negative for rash.  Neurological: Positive for dizziness. Negative for seizures, loss of consciousness and headaches.  Psychiatric/Behavioral: Negative for suicidal ideas.  All other systems reviewed and are negative.     ____________________________________________   PHYSICAL EXAM:  VITAL SIGNS: ED Triage Vitals  Enc Vitals Group     BP  11/25/20 1246 128/80     Pulse Rate 11/25/20 1246 81     Resp 11/25/20 1246 16     Temp 11/25/20 1246 98.1 F (36.7 C)     Temp Source 11/25/20 1246 Oral     SpO2 11/25/20 1246 97 %     Weight 11/25/20 1234 139 lb 15.9 oz (63.5 kg)     Height 11/25/20 1234 5\' 6"  (1.676 m)     Head Circumference --      Peak Flow --      Pain Score 11/25/20 1242 0     Pain Loc --      Pain Edu? --      Excl. in GC? --    Vitals:   11/25/20 1600 11/25/20 1700  BP: (!) 147/95 126/84  Pulse: (!) 105   Resp: 16 20  Temp: 98.1 F (36.7 C)   SpO2: 97%    Physical Exam Vitals and nursing note reviewed.  Constitutional:      Appearance: He is well-developed and well-nourished.  HENT:     Head: Normocephalic and atraumatic.     Right Ear: External ear normal.     Left Ear: External ear normal.     Nose: Nose normal.     Mouth/Throat:     Mouth: Mucous membranes are moist.  Eyes:     Conjunctiva/sclera: Conjunctivae normal.  Cardiovascular:     Rate and Rhythm: Regular rhythm. Tachycardia present.     Pulses: Normal pulses.     Heart sounds: No murmur heard.   Pulmonary:     Effort: Pulmonary effort is normal. No respiratory distress.     Breath sounds: Normal breath sounds.  Abdominal:     Palpations: Abdomen is soft.     Tenderness: There is no abdominal tenderness.  Musculoskeletal:        General: No edema.     Cervical back: Neck supple.  Skin:    General: Skin is warm and dry.     Capillary Refill: Capillary refill takes less than 2 seconds.  Neurological:     General: No focal deficit present.     Mental Status: He is alert and oriented to person, place, and time.     Cranial Nerves: No cranial nerve deficit.  Psychiatric:        Mood and Affect: Mood and affect and mood normal.      ____________________________________________   LABS (all labs ordered are listed, but only abnormal results are displayed)  Labs Reviewed  BASIC METABOLIC PANEL - Abnormal; Notable for  the following components:      Result Value   Glucose, Bld 101 (*)    All other components within normal limits  CBC  FIBRIN DERIVATIVES D-DIMER ([facility] ONLY)  MAGNESIUM  TSH  TROPONIN I (HIGH SENSITIVITY)  TROPONIN I (HIGH SENSITIVITY)   ____________________________________________  EKG  Sinus rhythm with PVC, ventricular rate of 82, normal axis, unremarkable intervals, no  clear evidence of acute ischemia although there is a nonspecific change in lead III without any other abnormalities noted.  ____________________________________________  RADIOLOGY  ED MD interpretation: Clear bilaterally without focal consolidation, pneumothorax, effusion, edema, or other clear acute intrathoracic process.  No widened mediastinum.   Official radiology report(s): DG Chest 2 View  Result Date: 11/25/2020 CLINICAL DATA:  Palpitations, COVID positive EXAM: CHEST - 2 VIEW COMPARISON:  None. FINDINGS: The heart size and mediastinal contours are within normal limits. Both lungs are clear. The visualized skeletal structures are unremarkable. IMPRESSION: No acute abnormality of the lungs. Electronically Signed   By: Lauralyn Primes M.D.   On: 11/25/2020 13:10    ____________________________________________   PROCEDURES  Procedure(s) performed (including Critical Care):  .1-3 Lead EKG Interpretation Performed by: Gilles Chiquito, MD Authorized by: Gilles Chiquito, MD     Interpretation: normal     ECG rate assessment: normal     Rhythm: sinus rhythm     Ectopy: none     Conduction: normal       ____________________________________________   INITIAL IMPRESSION / ASSESSMENT AND PLAN / ED COURSE      Patient presents with [place] to history exam for assessment of some lightheadedness experience today in the setting of recent COVID-19 diagnosis and 3 weeks of palpitations.  On arrival patient is afebrile hemodynamically stable.  Differential occludes but is not limited to lightheadedness  secondary to dehydration versus vasovagal versus arrhythmia, ACS, PE, heart failure, symptomatic anemia, metabolic derangements and alcohol drawl.  No history, chest x-ray, or exam findings to suggest pneumothorax, acute heart failure, acute traumatic injury, or other acute infectious process aside from possible persistent symptoms from COVID-19.  Low suspicion for ACS given patient denies any chest pain and has 2 nonelevated troponins obtained over 2 hours.  This is also not consistent with myocarditis no findings to suggest pericarditis.  BMP shows no significant ultralight or metabolic derangements.  CBC shows no evidence of acute anemia or leukocytosis.  Low suspicion for PE as D-dimer is less than 500.  Magnesium is unremarkable.  Patient does not appear significantly dehydrated on exam or lab.  Patient given some IV fluids for possible dehydration.  Given otherwise stable vitals with reassuring exam and work-up low suspicion for immediate life-threatening pathology or bleed patient is safe for discharge with plan for outpatient follow-up.  Encouraged to drink adequate electrolyte and calorie counting beverages.  Encouraged to decrease his alcohol consumption and he was advised that his current intake is is above the limit recommended by CDC..  Patient voiced understanding and agreement this plan.       ____________________________________________   FINAL CLINICAL IMPRESSION(S) / ED DIAGNOSES  Final diagnoses:  COVID  Lightheaded  Alcohol abuse counseling and surveillance    Medications  lactated ringers bolus 1,000 mL (has no administration in time range)     ED Discharge Orders    None       Note:  This document was prepared using Dragon voice recognition software and may include unintentional dictation errors.   Gilles Chiquito, MD 11/25/20 [phone]

## 2020-11-26 ENCOUNTER — Telehealth: Payer: Self-pay | Admitting: Nurse Practitioner

## 2020-11-26 NOTE — Telephone Encounter (Signed)
Ongoing productive cough and gets "winded" easily.  Was experiencing some palpitations yesterday

## 2020-11-26 NOTE — Telephone Encounter (Signed)
Spoke with patient on the phone. He has a mild ongoing cough, was see in ED yesterday due to palpitations. EKG and Chest XRAY were WNL.   Patient received IV hydration, is feeling improved today though fatigue is still present.   Discussed return to work can be delayed until energy has returned, patient is still within 10 days since positive test.   Also has positive family members he is helping to care for.   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   Will schedule a follow up with [place] or PCP if symptoms persist or with new concerns

## 2022-06-21 ENCOUNTER — Ambulatory Visit (INDEPENDENT_AMBULATORY_CARE_PROVIDER_SITE_OTHER): Payer: Self-pay | Admitting: Registered Nurse

## 2022-06-21 ENCOUNTER — Encounter: Payer: Self-pay | Admitting: Registered Nurse

## 2022-06-21 VITALS — BP 122/70 | HR 96 | Temp 98.7°F | Ht 66.0 in | Wt 162.6 lb

## 2022-06-21 DIAGNOSIS — L247 Irritant contact dermatitis due to plants, except food: Secondary | ICD-10-CM

## 2022-06-21 MED ORDER — CETIRIZINE HCL 10 MG PO TABS: 10.0000 mg | ORAL_TABLET | Freq: Two times a day (BID) | ORAL | 0 refills | Status: DC | PRN

## 2022-06-21 MED ORDER — CETIRIZINE HCL 10 MG PO TABS: 10.0000 mg | ORAL_TABLET | Freq: Two times a day (BID) | ORAL | 0 refills | Status: AC | PRN

## 2022-06-21 MED ORDER — PREDNISONE 10 MG PO TABS: ORAL_TABLET | ORAL | 0 refills | Status: AC

## 2022-06-21 MED ORDER — TRIAMCINOLONE ACETONIDE 0.1 % EX CREA: 1.0000 | TOPICAL_CREAM | Freq: Two times a day (BID) | CUTANEOUS | 0 refills | Status: AC | PRN

## 2022-06-21 NOTE — Patient Instructions (Signed)
Poison Oak Dermatitis  Poison oak dermatitis is inflammation of the skin that is caused by contact with the chemicals in the leaves of the poison oak (Toxicodendron) plant. The skin reaction often includes redness, swelling, blisters, and extreme itching. What are the causes? This condition is caused by a specific chemical (urushiol) that is found in the sap of the poison oak plant. This chemical is sticky and can be easily spread to people, animals, and objects. You can get poison oak dermatitis by: Having direct contact with a poison oak plant. Touching animals, other people, or objects that have come in contact with poison oak and have the chemical on them. What increases the risk? This condition is more likely to develop in people who: Are outdoors often in wooded or [place] areas. Go outdoors without wearing protective clothing, such as closed shoes, long pants, and a long-sleeved shirt. What are the signs or symptoms? Symptoms of this condition include: Redness of the skin. Extreme itching. A rash that often includes bumps and blisters. The rash usually appears 48 hours after exposure if you have been exposed before. If this is the first time you have been exposed, the rash may not appear until a week after exposure. Swelling. This may occur if the reaction is more severe. Symptoms usually last for 1-2 weeks. However, the first time you develop this condition, symptoms may last 3-4 weeks. How is this diagnosed? This condition may be diagnosed based on your symptoms and a physical exam. Your health care provider may also ask you about any recent outdoor activity. How is this treated? Treatment for this condition will vary depending on how severe it is. Treatment may include: Hydrocortisone creams or calamine lotions to relieve itching. Oatmeal baths to soothe the skin. Over-the-counter antihistamine medicines to help reduce itching. Steroid medicine taken by mouth (orally) for more  severe reactions. Follow these instructions at home: Medicines Take or apply over-the-counter and prescription medicines only as told by your health care provider. Use hydrocortisone creams or calamine lotion as needed to soothe the skin and relieve itching. General instructions Do not scratch or rub your skin. Apply a cold, wet cloth (cold compress) to the affected areas or take baths in cool water. This will help with itching. Avoid hot baths and showers. Take oatmeal baths as needed. Use colloidal oatmeal. You can get this at your local pharmacy or grocery store. Follow the instructions on the packaging. While you have the rash, wash clothes right after you wear them. Keep all follow-up visits as told by your health care provider. This is important. How is this prevented?  Learn to identify the poison oak plant and avoid contact with the plant. This plant can be recognized by the number of leaves. Generally, poison oak has three leaves with flowering branches on a single stem. The leaves are often a bit fuzzy and have a toothlike edge. If you have been exposed to poison oak, thoroughly wash your skin with soap and water right away. You have about 30 minutes to remove the plant resin before it will cause the rash. Be sure to wash under your fingernails because any plant resin there will continue to spread the rash. When hiking or camping, wear clothes that will help you avoid exposure on the skin. This includes long pants, a long-sleeved shirt, tall socks, and hiking boots. You can also apply preventive lotion to your skin to help limit exposure. If you suspect that your clothes or outdoor gear came in contact  with poison oak, rinse them off outside with a garden hose before bringing them inside your house. When doing yard work or gardening, wear gloves, long sleeves, long pants, and boots. Wash your garden tools and gloves if they come in contact with poison oak. If you suspect that your pet has  come into contact with poison oak, wash him or her with pet shampoo and water. Make sure you wear gloves while washing your pet. Do not burn poison oak plants. This can release the chemical from the plant into the air and may cause a reaction on the skin or eyes, or in the lungs from breathing in the smoke. Contact a health care provider if you have: Open sores in the rash area. More redness, swelling, or pain in the affected area. Redness that spreads beyond the rash area. Fluid, blood, or pus coming from the affected area. A fever. A rash over a large area of your body. A rash on your eyes, mouth, or genitals. A rash that does not improve after a few weeks. Get help right away if: Your face swells or your eyes swell shut. You have trouble breathing. You have trouble swallowing. These symptoms may represent a serious problem that is an emergency. Do not wait to see if the symptoms will go away. Get medical help right away. Call your local emergency services (911 in the [place] Do not drive yourself to the hospital. Summary Poison oak dermatitis is inflammation of the skin that is caused by contact with the chemicals on the leaves of the poison oak (Toxicodendron) plant. Symptoms of this condition include redness, extreme itching, a rash, and swelling. Do not scratch or rub your skin. Take or apply over-the-counter and prescription medicines only as told by your health care provider. This information is not intended to replace advice given to you by your health care provider. Make sure you discuss any questions you have with your health care provider. Document Revised: 02/16/2019 Document Reviewed: 11/24/2018 Elsevier Patient Education  [place] Dermatitis Poison ivy dermatitis is inflammation of the skin that is caused by chemicals in the leaves of the poison ivy plant. The skin reaction often involves redness, swelling, blisters, and extreme itching. What are the  causes? This condition is caused by a chemical (urushiol) found in the sap of the poison ivy plant. This chemical is sticky and can be easily spread to people, animals, and objects. You can get poison ivy dermatitis by: Having direct contact with a poison ivy plant. Touching animals, other people, or objects that have come in contact with poison ivy and have the chemical on them. What increases the risk? This condition is more likely to develop in people who: Are outdoors often in wooded or [place] areas. Go outdoors without wearing protective clothing, such as closed shoes, long pants, and a long-sleeved shirt. What are the signs or symptoms? Symptoms of this condition include: Redness of the skin. Extreme itching. A rash that often includes bumps and blisters. The rash usually appears 48 hours after exposure, if you have been exposed before. If this is the first time you have been exposed, the rash may not appear until a week after exposure. Swelling. This may occur if the reaction is more severe. Symptoms usually last for 1-2 weeks. However, the first time you develop this condition, symptoms may last 3-4 weeks. How is this diagnosed? This condition may be diagnosed based on your symptoms and a physical exam. Your health care provider  may also ask you about any recent outdoor activity. How is this treated? Treatment for this condition will vary depending on how severe it is. Treatment may include: Hydrocortisone cream or calamine lotion to relieve itching. Oatmeal baths to soothe the skin. Medicines, such as over-the-counter antihistamine tablets. Oral steroid medicine, for more severe reactions. Follow these instructions at home: Medicines Take or apply over-the-counter and prescription medicines only as told by your health care provider. Use hydrocortisone cream or calamine lotion as needed to soothe the skin and relieve itching. General instructions Do not scratch or rub your  skin. Apply a cold, wet cloth (cold compress) to the affected areas or take baths in cool water. This will help with itching. Avoid hot baths and showers. Take oatmeal baths as needed. Use colloidal oatmeal. You can get this at your local pharmacy or grocery store. Follow the instructions on the packaging. While you have the rash, wash clothes right after you wear them. Keep all follow-up visits as told by your health care provider. This is important. How is this prevented?  Learn to identify the poison ivy plant and avoid contact with the plant. This plant can be recognized by the number of leaves. Generally, poison ivy has three leaves with flowering branches on a single stem. The leaves are typically glossy, and they have jagged edges that come to a point at the front. If you have been exposed to poison ivy, thoroughly wash with soap and water right away. You have about 30 minutes to remove the plant resin before it will cause the rash. Be sure to wash under your fingernails, because any plant resin there will continue to spread the rash. When hiking or camping, wear clothes that will help you to avoid exposure on the skin. This includes long pants, a long-sleeved shirt, tall socks, and hiking boots. You can also apply preventive lotion to your skin to help limit exposure. If you suspect that your clothes or outdoor gear came in contact with poison ivy, rinse them off outside with a garden hose before you bring them inside your house. When doing yard work or gardening, wear gloves, long sleeves, long pants, and boots. Wash your garden tools and gloves if they come in contact with poison ivy. If you suspect that your pet has come into contact with poison ivy, wash him or her with pet shampoo and water. Make sure to wear gloves while washing your pet. Contact a health care provider if you have: Open sores in the rash area. More redness, swelling, or pain in the affected area. Redness that spreads  beyond the rash area. Fluid, blood, or pus coming from the affected area. A fever. A rash over a large area of your body. A rash on your eyes, mouth, or genitals. A rash that does not improve after a few weeks. Get help right away if: Your face swells or your eyes swell shut. You have trouble breathing. You have trouble swallowing. These symptoms may represent a serious problem that is an emergency. Do not wait to see if the symptoms will go away. Get medical help right away. Call your local emergency services (911 in the [place] Do not drive yourself to the hospital. Summary Poison ivy dermatitis is inflammation of the skin that is caused by chemicals in the leaves of the poison ivy plant. Symptoms of this condition include redness, itching, a rash, and swelling. Do not scratch or rub your skin. Take or apply over-the-counter and prescription medicines only as  told by your health care provider. This information is not intended to replace advice given to you by your health care provider. Make sure you discuss any questions you have with your health care provider. Document Revised: 08/10/2021 Document Reviewed: 08/10/2021 Elsevier Patient Education  2023 [employer] Contact Dermatitis Dermatitis is redness, soreness, and swelling (inflammation) of the skin. Contact dermatitis is a reaction to certain substances that touch the skin. Many different substances can cause contact dermatitis. There are two types of contact dermatitis: Irritant contact dermatitis. This type is caused by something that irritates your skin, such as having dry hands from washing them too often with soap. This type does not require previous exposure to the substance for a reaction to occur. This is the most common type. Allergic contact dermatitis. This type is caused by a substance that you are allergic to, such as poison ivy. This type occurs when you have been exposed to the substance (allergen) and develop a  sensitivity to it. Dermatitis may develop soon after your first exposure to the allergen, or it may not develop until the next time you are exposed and every time thereafter. What are the causes? Irritant contact dermatitis is most commonly caused by exposure to: Makeup. Soaps. Detergents. Bleaches. Acids. Metal salts, such as nickel. Allergic contact dermatitis is most commonly caused by exposure to: Poisonous plants. Chemicals. Jewelry. Latex. Medicines. Preservatives in products, such as clothing. What increases the risk? You are more likely to develop this condition if you have: A job that exposes you to irritants or allergens. Certain medical conditions, such as asthma or eczema. What are the signs or symptoms? Symptoms of this condition may occur on your body anywhere the irritant has touched you or is touched by you. Symptoms include: Dryness or flaking. Redness. Cracks. Itching. Pain or a burning feeling. Blisters. Drainage of small amounts of blood or clear fluid from skin cracks. With allergic contact dermatitis, there may also be swelling in areas such as the eyelids, mouth, or genitals. How is this diagnosed? This condition is diagnosed with a medical history and physical exam. A patch skin test may be performed to help determine the cause. If the condition is related to your job, you may need to see an occupational medicine specialist. How is this treated? This condition is treated by checking for the cause of the reaction and protecting your skin from further contact. Treatment may also include: Steroid creams or ointments. Oral steroid medicines may be needed in more severe cases. Antibiotic medicines or antibacterial ointments, if a skin infection is present. Antihistamine lotion or an antihistamine taken by mouth to ease itching. A bandage (dressing). Follow these instructions at home: Skin care Moisturize your skin as needed. Apply cool compresses to the  affected areas. Try applying baking soda paste to your skin. Stir water into baking soda until it reaches a paste-like consistency. Do not scratch your skin, and avoid friction to the affected area. Avoid the use of soaps, perfumes, and dyes. Medicines Take or apply over-the-counter and prescription medicines only as told by your health care provider. If you were prescribed an antibiotic medicine, take or apply the antibiotic as told by your health care provider. Do not stop using the antibiotic even if your condition improves. Bathing Try taking a bath with: Epsom salts. Follow the instructions on the packaging. You can get these at your local pharmacy or grocery store. Baking soda. Pour a small amount into the bath as directed by your health care  provider. Colloidal oatmeal. Follow the instructions on the packaging. You can get this at your local pharmacy or grocery store. Bathe less frequently, such as every other day. Bathe in lukewarm water. Avoid using hot water. Bandage care If you were given a bandage (dressing), change it as told by your health care provider. Wash your hands with soap and water before and after you change your dressing. If soap and water are not available, use hand sanitizer. General instructions Avoid the substance that caused your reaction. If you do not know what caused it, keep a journal to try to track what caused it. Write down: What you eat. What cosmetic products you use. What you drink. What you wear in the affected area. This includes jewelry. Check the affected areas every day for signs of infection. Check for: More redness, swelling, or pain. More fluid or blood. Warmth. Pus or a bad smell. Keep all follow-up visits as told by your health care provider. This is important. Contact a health care provider if: Your condition does not improve with treatment. Your condition gets worse. You have signs of infection such as swelling, tenderness, redness,  soreness, or warmth in the affected area. You have a fever. You have new symptoms. Get help right away if: You have a severe headache, neck pain, or neck stiffness. You vomit. You feel very sleepy. You notice red streaks coming from the affected area. Your bone or joint underneath the affected area becomes painful after the skin has healed. The affected area turns darker. You have difficulty breathing. Summary Dermatitis is redness, soreness, and swelling (inflammation) of the skin. Contact dermatitis is a reaction to certain substances that touch the skin. Symptoms of this condition may occur on your body anywhere the irritant has touched you or is touched by you. This condition is treated by figuring out what caused the reaction and protecting your skin from further contact. Treatment may also include medicines and skin care. Avoid the substance that caused your reaction. If you do not know what caused it, keep a journal to try to track what caused it. Contact a health care provider if your condition gets worse or you have signs of infection such as swelling, tenderness, redness, soreness, or warmth in the affected area. This information is not intended to replace advice given to you by your health care provider. Make sure you discuss any questions you have with your health care provider. Document Revised: 08/10/2021 Document Reviewed: 08/10/2021 Elsevier Patient Education  2023 [employer].

## 2022-06-21 NOTE — Progress Notes (Signed)
Subjective:    Patient ID: Russell Rodgers., male    DOB: February 08, 1981, 41 y.o.   MRN: [id number]  41y/o married caucasian male established patient here for evaluation rash hands/left shoulder/chest after pulling weeds in yard this weekend.  Itching but denied discharge, fever, chills, n/v/d, dyspnea, dysphagia, dyphasia, wheezing.  Spouse with rash today now also.  Unsure if poison oak/ivy here for rash evaluation to ensure not a contagious infection.       Review of Systems  Constitutional:  Negative for activity change, appetite change, chills, diaphoresis, fatigue and fever.  HENT:  Negative for congestion, facial swelling, sore throat, trouble swallowing and voice change.   Eyes:  Negative for photophobia, discharge, itching and visual disturbance.  Respiratory:  Negative for cough, choking, chest tightness, shortness of breath, wheezing and stridor.   Gastrointestinal:  Negative for diarrhea, nausea and vomiting.  Endocrine: Negative for cold intolerance and heat intolerance.  Genitourinary:  Negative for difficulty urinating.  Musculoskeletal:  Negative for arthralgias, back pain, gait problem, joint swelling, myalgias, neck pain and neck stiffness.  Skin:  Positive for color change and rash. Negative for pallor and wound.  Allergic/Immunologic: Positive for environmental allergies. Negative for food allergies.  Neurological:  Negative for dizziness, tremors, seizures, syncope, facial asymmetry, speech difficulty, weakness, light-headedness, numbness and headaches.  Hematological:  Negative for adenopathy. Does not bruise/bleed easily.  Psychiatric/Behavioral:  Negative for agitation, confusion and sleep disturbance.        Objective:   Physical Exam Vitals and nursing note reviewed.  Constitutional:      General: He is awake. He is not in acute distress.    Appearance: Normal appearance. He is well-developed, well-groomed and normal weight. He is not ill-appearing,  toxic-appearing or diaphoretic.  HENT:     Head: Normocephalic and atraumatic.     Jaw: There is normal jaw occlusion.     Salivary Glands: Right salivary gland is not diffusely enlarged or tender. Left salivary gland is not diffusely enlarged or tender.     Right Ear: Hearing, tympanic membrane, ear canal and external ear normal.     Left Ear: Hearing, tympanic membrane, ear canal and external ear normal.     Nose: Nose normal. No congestion or rhinorrhea.     Right Turbinates: Not enlarged, swollen or pale.     Left Turbinates: Not enlarged, swollen or pale.     Right Sinus: No maxillary sinus tenderness or frontal sinus tenderness.     Left Sinus: No maxillary sinus tenderness or frontal sinus tenderness.     Mouth/Throat:     Lips: Pink. No lesions.     Mouth: Mucous membranes are moist. No oral lesions or angioedema.     Dentition: No gum lesions.     Tongue: No lesions. Tongue does not deviate from midline.     Palate: No mass and lesions.     Pharynx: Oropharynx is clear. Uvula midline. No pharyngeal swelling, oropharyngeal exudate, posterior oropharyngeal erythema or uvula swelling.     Tonsils: No tonsillar exudate.  Eyes:     General: Lids are normal. Vision grossly intact. Gaze aligned appropriately. Allergic shiner present. No scleral icterus.       Right eye: No discharge.        Left eye: No discharge.     Extraocular Movements: Extraocular movements intact.     Conjunctiva/sclera: Conjunctivae normal.     Right eye: Right conjunctiva is not injected. No chemosis, exudate or hemorrhage.  Left eye: Left conjunctiva is not injected. No chemosis, exudate or hemorrhage.    Pupils: Pupils are equal, round, and reactive to light.  Neck:     Trachea: Trachea and phonation normal. No tracheal deviation.  Cardiovascular:     Rate and Rhythm: Normal rate and regular rhythm.     Pulses: Normal pulses.          Radial pulses are 2+ on the right side and 2+ on the left side.   Pulmonary:     Effort: Pulmonary effort is normal. No respiratory distress.     Breath sounds: Normal breath sounds and air entry. No stridor, decreased air movement or transmitted upper airway sounds. No decreased breath sounds or wheezing.     Comments: Spoke full sentences without difficulty; no cough observed in exam room Abdominal:     General: Abdomen is flat.  Musculoskeletal:        General: Swelling and tenderness present. No deformity or signs of injury. Normal range of motion.     Right elbow: No swelling or lacerations. Normal range of motion.     Left elbow: No swelling or lacerations. Normal range of motion.     Right hand: No lacerations. Normal strength. Normal capillary refill.     Left hand: No lacerations. Normal strength. Normal capillary refill.     Cervical back: Normal range of motion and neck supple. No swelling, edema, deformity, erythema, signs of trauma, lacerations, rigidity, tenderness or crepitus. No pain with movement or muscular tenderness. Normal range of motion.     Thoracic back: No swelling, edema, deformity, signs of trauma or lacerations. Normal range of motion.     Right lower leg: No edema.     Left lower leg: No edema.  Lymphadenopathy:     Head:     Right side of head: No submandibular or preauricular adenopathy.     Left side of head: No submandibular or preauricular adenopathy.     Cervical:     Right cervical: No superficial cervical adenopathy.    Left cervical: No superficial cervical adenopathy.  Skin:    General: Skin is warm and dry.     Capillary Refill: Capillary refill takes less than 2 seconds.     Coloration: Skin is not ashen, cyanotic, jaundiced, mottled, pale or sallow.     Findings: Erythema and rash present. No abrasion, abscess, acne, bruising, burn, ecchymosis, signs of injury, laceration, lesion, petechiae or wound. Rash is macular, papular and vesicular. Rash is not crusting, nodular, purpuric, pustular, scaling or  urticarial.     Nails: There is no clubbing.          Comments: Dorsum bilateral hands overlying MCP joints macular erythema overlying papules and few fine vesicles dry noted; facial at left eyebrow 2 papules with erythema; lateral nose 2 macular erythema; anterior left chest grouping nummular macular erythema and papules less than 3cm diameter in linear pattern; 2 nummular macular erythema scattered papules left forearm dry  Neurological:     General: No focal deficit present.     Mental Status: He is alert and oriented to person, place, and time. Mental status is at baseline.     GCS: GCS eye subscore is 4. GCS verbal subscore is 5. GCS motor subscore is 6.     Cranial Nerves: Cranial nerves 2-12 are intact. No cranial nerve deficit, dysarthria or facial asymmetry.     Sensory: Sensation is intact.     Motor: Motor function is intact.  No weakness, tremor, atrophy, abnormal muscle tone or seizure activity.     Coordination: Coordination is intact. Coordination normal.     Gait: Gait is intact. Gait normal.     Comments: On/off exam table; gait sure and steady in clinic; bilateral hand grasp equal 5/5  Psychiatric:        Attention and Perception: Attention and perception normal.        Mood and Affect: Mood and affect normal.        Speech: Speech normal.        Behavior: Behavior normal. Behavior is cooperative.        Thought Content: Thought content normal.        Cognition and Memory: Cognition and memory normal.        Judgment: Judgment normal.           Assessment & Plan:   A-irritant contact dermatitis due to plants not food  P-patient has used zyrtec in the past and has at home.  Zyrtec 10mg  po BID prn itching #30 RF0.  Stop if taking atarax/hydroxyzine as in the same family typically atarax/hydroxzyine causes more sedation but stronger anti itch effect.  Patient would like to try topical first triamcinolone cream 0.1% may apply to affected areas BID prn itching/rash #1  RF0 electronic Rx sent to his pharmacy of choice.  Instead of oral zyrtec may use benadryl gel/spray BID prn itching alternative.  Wash hands before and after application cool water.  Avoid hot steam showers.  Apply emollient twice a day e.g. Fragrance free vaseline.  May apply ice/cold compress 5 minutes QID prn itching/swelling.  Avoid rubbing/scratching rash as releases more histamines/irritates already irritated skin further can result in secondary infection.  If rash continuing to spread stop triamcinolone and start prednisone 10mg  taper 40mg  (4 pills) x 2 days then 30 mg x 2 day then 20mg  x 2 days then 10mg  x 2 days #20 RF0 sent to his pharmacy of choice.  Discussed may cause insomnia/increased/decreased appetite, increases blood sugar/blood pressure/heart rate. Patient stated has not taken atarax in the previous year and has not had problems with other medications worsening his anxiety.  Discussed oils from poison oak/ivy can be spread in house.  If person susceptible could develop rash also.  Discussed other plants can cause irritant contact dermatitis also.  He reported handling multiple weeds/plants this weekend doing yard work.   I recommend to take prednisone with breakfast.Avoid harsh/abrasive soaps use fragrance free/sensitive like dove/cetaphil.    Medication as directed. Call or return to clinic as needed if these symptoms worsen or fail to improve as anticipated. [employer] handouts on contact dermatitis, poison oak/ivy and pruritis printed and given to patient.  Patient refused work note.  Follow up for re-evaluation in 48 hours if no improvement and/or worsening of rash with plan of care. Patient verbalized agreement and understanding of treatment plan and had no further questions at this time

## 2022-06-28 ENCOUNTER — Other Ambulatory Visit: Payer: Self-pay | Admitting: Registered Nurse
